# Patient Record
Sex: Male | Born: 1960 | Race: White | Hispanic: No | Marital: Single | Smoking: Current every day smoker
Health system: Southern US, Community
[De-identification: ages and names within clinical notes are randomized; demographics above are authoritative.]

## PROBLEM LIST (undated history)

## (undated) DIAGNOSIS — I10 Essential (primary) hypertension: Secondary | ICD-10-CM

---

## 2009-04-11 ENCOUNTER — Ambulatory Visit: Payer: Self-pay | Admitting: Gastroenterology

## 2012-06-13 ENCOUNTER — Ambulatory Visit: Payer: Self-pay | Admitting: Family Medicine

## 2012-06-13 LAB — RAPID STREP-A WITH REFLX: Micro Text Report: NEGATIVE

## 2012-06-16 LAB — BETA STREP CULTURE(ARMC)

## 2013-10-30 DIAGNOSIS — I10 Essential (primary) hypertension: Secondary | ICD-10-CM | POA: Insufficient documentation

## 2013-10-30 DIAGNOSIS — J302 Other seasonal allergic rhinitis: Secondary | ICD-10-CM | POA: Insufficient documentation

## 2013-10-30 DIAGNOSIS — D72819 Decreased white blood cell count, unspecified: Secondary | ICD-10-CM | POA: Insufficient documentation

## 2013-11-02 DIAGNOSIS — E781 Pure hyperglyceridemia: Secondary | ICD-10-CM | POA: Insufficient documentation

## 2014-04-30 ENCOUNTER — Ambulatory Visit: Payer: Self-pay | Admitting: Family Medicine

## 2015-03-03 ENCOUNTER — Ambulatory Visit
Admission: EM | Admit: 2015-03-03 | Discharge: 2015-03-03 | Disposition: A | Discharge status: Home / Self Care | Payer: BLUE CROSS/BLUE SHIELD | Attending: Family Medicine | Admitting: Family Medicine

## 2015-03-03 DIAGNOSIS — J0100 Acute maxillary sinusitis, unspecified: Secondary | ICD-10-CM

## 2015-03-03 DIAGNOSIS — J0110 Acute frontal sinusitis, unspecified: Secondary | ICD-10-CM | POA: Diagnosis not present

## 2015-03-03 HISTORY — DX: Essential (primary) hypertension: I10

## 2015-03-03 MED ORDER — AMOXICILLIN-POT CLAVULANATE 875-125 MG PO TABS
1.0000 | ORAL_TABLET | Freq: Two times a day (BID) | ORAL | Status: DC
Start: 2015-03-03 — End: 2016-05-16

## 2015-03-03 NOTE — Discharge Instructions (Signed)

## 2015-03-03 NOTE — ED Notes (Signed)
Started 3 1/2 weeks ago with cough. "I've gone back and forth with nasal congestion". This past week causing dizziness. + pressure behind eyes and ears

## 2015-03-03 NOTE — ED Provider Notes (Signed)
[facility]  ____________________________________________  Time seen: Approximately 3:11 PM  I have reviewed the triage vital signs and the nursing notes.   HISTORY  Chief Complaint URI   HPI Michael Dalton. is a 55 y.o. male  presents for the complaint of 3-4 weeks of runny nose, nasal congestion, cough. Patient reports that symptoms initially started as a cough and chest congestion and states that he no longer has cough and chest congestion but now has primarily sinus pressure and sinus drainage. States current sinus pressure is 4 out of 10 aching around his forehead and cheeks. Reports frequently getting thick greenish nasal drainage. Reports using Nettie pot at home as well as over-the-counter Coricidin which helps his symptoms.  Denies known fevers. Reports continues to eat and drink well. Does report some occasional dizziness with position changes. Denies current dizziness. Patient reports has a history of dizziness with sinus infections.  Denies chest pain or shortness of breath, fevers, neck or back pain, fall, head injury, weakness, fevers.    Past Medical History  Diagnosis Date  . Hypertension     There are no active problems to display for this patient.   History reviewed. No pertinent past surgical history.  Current Outpatient Rx  Name  Route  Sig  Dispense  Refill  . Chlorpheniramine-Acetaminophen (CORICIDIN HBP COLD/FLU PO)   Oral   Take by mouth.         Marland Kitchen lisinopril (PRINIVIL,ZESTRIL) 10 MG tablet   Oral   Take 10 mg by mouth daily.           Allergies Review of patient's allergies indicates no known allergies.  Family History  Problem Relation Age of Onset  . Cancer Father     Social History Social History  Substance Use Topics  . Smoking status: Current Every Day Smoker -- 1.00 packs/day    Types: Cigarettes  . Smokeless tobacco: None  . Alcohol Use: Yes    Review of Systems Constitutional: No fever/chills Eyes: No  visual changes. ENT: No sore throat. Positive runny nose, nasal congestion, sinus pressure and sinus drainage. Cardiovascular: Denies chest pain. Respiratory: Denies shortness of breath. Gastrointestinal: No abdominal pain.  No nausea, no vomiting.  No diarrhea.  No constipation. Genitourinary: Negative for dysuria. Musculoskeletal: Negative for back pain. Skin: Negative for rash. Neurological: Negative for headaches, focal weakness or numbness.  10-point ROS otherwise negative.  ____________________________________________   PHYSICAL EXAM:  VITAL SIGNS: ED Triage Vitals  Enc Vitals Group     BP 03/03/15 1458 156/100 mmHg     Pulse Rate 03/03/15 1458 81     Resp 03/03/15 1458 16     Temp 03/03/15 1458 98.3 F (36.8 C)     Temp Source 03/03/15 1458 Tympanic     SpO2 03/03/15 1458 97 %     Weight 03/03/15 1458 210 lb (95.255 kg)     Height 03/03/15 1458 6\' 1"  (1.854 m)     Head Cir --      Peak Flow --      Pain Score 03/03/15 1502 9     Pain Loc --      Pain Edu? --      Excl. in GC? --     Constitutional: Alert and oriented. Well appearing and in no acute distress. Eyes: Conjunctivae are normal. PERRL. EOMI. Head: Atraumatic. Mild to moderate tenderness to palpation bilateral frontal and maxillary sinuses. No swelling. No erythema.  Ears: no erythema, normal TMs bilaterally.  Nose:Nasal congestion with bilateral nasal turbinate erythema.   Mouth/Throat: Mucous membranes are moist.  Oropharynx non-erythematous. No tonsillar swelling or exudate.  Neck: No stridor.  No cervical spine tenderness to palpation. Hematological/Lymphatic/Immunilogical: No cervical lymphadenopathy. Cardiovascular: Normal rate, regular rhythm. Grossly normal heart sounds.  Good peripheral circulation. Respiratory: Normal respiratory effort.  No retractions. Lungs CTAB. No wheezes, rales or rhonchi.  Gastrointestinal: Soft and nontender.  Musculoskeletal: No lower or upper extremity tenderness  nor edema. No cervical, thoracic or lumbar tenderness to palpation.  Neurologic:  Normal speech and language. No gross focal neurologic deficits are appreciated. No gait instability. Skin:  Skin is warm, dry and intact. No rash noted. Psychiatric: Mood and affect are normal. Speech and behavior are normal.  ____________________________________________   LABS (all labs ordered are listed, but only abnormal results are displayed)  Labs Reviewed - No data to display ____________________________________________   INITIAL IMPRESSION / ASSESSMENT AND PLAN / ED COURSE  Pertinent labs & imaging results that were available during my care of the patient were reviewed by me and considered in my medical decision making (see chart for details).  Very well-appearing patient. No acute distress. Presents for complaint of 3-4 weeks of nasal congestion, sinus pressure and sinus drainage. Mild to moderate tenderness to palpation of frontal and maxillary sinuses. Unrelieved with over-the-counter medications. Lungs clear throughout. Abdomen soft and nontender. Moist mucous membranes. Will treat maxillary and frontal sinusitis with oral Augmentin and supportive treatments including continuing home Nettie pot, saline rinses and Coricidin. Follow PCP as needed.  Discussed follow up with Primary care physician this week. Discussed follow up and return parameters including chest pain, shortness of breath, abdominal pain, dizziness, weakness, vision changes,  no resolution or any worsening concerns. Patient verbalized understanding and agreed to plan.   ____________________________________________   FINAL CLINICAL IMPRESSION(S) / ED DIAGNOSES  Final diagnoses:  Acute frontal sinusitis, recurrence not specified  Acute maxillary sinusitis, recurrence not specified       Renford DillsLindsey Manjinder Breau, NP 03/03/15 1705

## 2016-05-16 ENCOUNTER — Ambulatory Visit
Admission: EM | Admit: 2016-05-16 | Discharge: 2016-05-16 | Disposition: A | Discharge status: Home / Self Care | Payer: BLUE CROSS/BLUE SHIELD | Attending: Family Medicine | Admitting: Family Medicine

## 2016-05-16 ENCOUNTER — Ambulatory Visit
Admit: 2016-05-16 | Discharge: 2016-05-16 | Disposition: A | Discharge status: Home / Self Care | Payer: BLUE CROSS/BLUE SHIELD | Source: Ambulatory Visit | Attending: Emergency Medicine | Admitting: Emergency Medicine

## 2016-05-16 DIAGNOSIS — N451 Epididymitis: Secondary | ICD-10-CM

## 2016-05-16 DIAGNOSIS — R52 Pain, unspecified: Secondary | ICD-10-CM | POA: Diagnosis not present

## 2016-05-16 DIAGNOSIS — N5082 Scrotal pain: Secondary | ICD-10-CM | POA: Diagnosis present

## 2016-05-16 LAB — URINALYSIS, COMPLETE (UACMP) WITH MICROSCOPIC
Bilirubin Urine: NEGATIVE
Glucose, UA: NEGATIVE mg/dL
Ketones, ur: NEGATIVE mg/dL
Nitrite: POSITIVE — AB
Protein, ur: 30 mg/dL — AB
Specific Gravity, Urine: 1.02 (ref 1.005–1.030)
Squamous Epithelial / LPF: NONE SEEN
pH: 7 (ref 5.0–8.0)

## 2016-05-16 LAB — CHLAMYDIA/NGC RT PCR (ARMC ONLY)
Chlamydia Tr: NOT DETECTED
N gonorrhoeae: NOT DETECTED

## 2016-05-16 MED ORDER — TRAMADOL HCL 50 MG PO TABS: 50.0000 mg | ORAL_TABLET | Freq: Four times a day (QID) | ORAL | 0 refills | Status: DC | PRN

## 2016-05-16 MED ORDER — LEVOFLOXACIN 500 MG PO TABS
500.0000 mg | ORAL_TABLET | Freq: Once | ORAL | Status: AC
  Administered 2016-05-16: 500 mg via ORAL

## 2016-05-16 MED ORDER — LEVOFLOXACIN 500 MG PO TABS: 500.0000 mg | ORAL_TABLET | Freq: Every day | ORAL | 0 refills | Status: DC

## 2016-05-16 MED ORDER — CEFTRIAXONE SODIUM 250 MG IJ SOLR
500.0000 mg | Freq: Once | INTRAMUSCULAR | Status: AC
  Administered 2016-05-16: 500 mg via INTRAMUSCULAR

## 2016-05-16 NOTE — ED Provider Notes (Signed)
CSN: [id number]     Arrival date & time 05/16/16  7425 History   First MD Initiated Contact with Patient 05/16/16 [phone]     Chief Complaint  Patient presents with  . Testicle Pain    left   (Consider location/radiation/quality/duration/timing/severity/associated sxs/prior Treatment) HPI  56 year old male who presents with left testicular pain started hurting about 2 weeks ago.He stated that now has become more swollen and looks inflamed. Does state that he gets epididymitis about every 2-3 years. He has associated when he lifts heavy things. 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 Denies any fever or chills. He states that that he is homosexual has a regular partner but does practice insertive anal intercourse without a condom. He recently noticed a discharge from his penis with a yellow discharge noticed on his underwear.       Past Medical History:  Diagnosis Date  . Hypertension    History reviewed. No pertinent surgical history. Family History  Problem Relation Age of Onset  . Cancer Father    Social History  Substance Use Topics  . Smoking status: Current Every Day Smoker    Packs/day: 1.00    Types: Cigarettes  . Smokeless tobacco: Never Used  . Alcohol use Yes    Review of Systems  Constitutional: Positive for activity change. Negative for chills, fatigue and fever.  Genitourinary: Positive for discharge, dysuria, scrotal swelling and testicular pain. Negative for penile pain.  All other systems reviewed and are negative.   Allergies  Patient has no known allergies.  Home Medications   Prior to Admission medications   Medication Sig Start Date End Date Taking? Authorizing Provider  lisinopril (PRINIVIL,ZESTRIL) 10 MG tablet Take 10 mg by mouth daily.   Yes Historical Provider, MD   levofloxacin (LEVAQUIN) 500 MG tablet Take 1 tablet (500 mg total) by mouth daily. 05/16/16   Lutricia Feil, PA-C  traMADol (ULTRAM) 50 MG tablet Take 1 tablet (50 mg total) by mouth every 6 (six) hours as needed. 05/16/16   Lutricia Feil, PA-C   Meds Ordered and Administered this Visit   Medications  cefTRIAXone (ROCEPHIN) injection 500 mg (500 mg Intramuscular Given 05/16/16 0959)  levofloxacin (LEVAQUIN) tablet 500 mg (500 mg Oral Given 05/16/16 1001)    BP (!) 144/94 (BP Location: Left Arm)   Pulse 99   Temp 98.3 F (36.8 C) (Oral)   Resp 18   Ht 6' (1.829 m)   Wt 210 lb (95.3 kg)   SpO2 100%   BMI 28.48 kg/m  No data found.   Physical Exam  Constitutional: He is oriented to person, place, and time. He appears well-developed and well-nourished. No distress.  HENT:  Head: Normocephalic and atraumatic.  Eyes: Pupils are equal, round, and reactive to light.  Neck: Normal range of motion.  Abdominal: Soft. He exhibits no distension. There is no tenderness. There is no rebound and no guarding.  Genitourinary: Penis normal.  Genitourinary Comments: Male genital examination shows a normal male phallus. No ecchymosis or erythema present. The right is normal. Left scrotum shows some swelling. Right testicle is normal and does not have any tenderness or swelling. However left is swollen has a lateral lying orientation. There is increased swelling posteriorly up into the upper part of the scrotum. Cremasteric  reflex present. No herniation directly,but possible scrotal indirect herniation in his scrotum. Swelling limits palpation.  Musculoskeletal: Normal range of motion.  Neurological: He is alert and oriented to person, place, and time.  Skin: Skin is warm and dry. He is not diaphoretic.  Psychiatric: He has a normal mood and affect. His behavior is normal. Judgment and thought content normal.  Nursing note and vitals reviewed.   Urgent Care Course     Procedures (including  critical care time)  Labs Review Labs Reviewed  URINALYSIS, COMPLETE (UACMP) WITH MICROSCOPIC - Abnormal; Notable for the following:       Result Value   APPearance CLOUDY (*)    Hgb urine dipstick MODERATE (*)    Protein, ur 30 (*)    Nitrite POSITIVE (*)    Leukocytes, UA LARGE (*)    Bacteria, UA MANY (*)    All other components within normal limits  URINE CULTURE  CHLAMYDIA/NGC RT PCR [facility] ONLY)    Imaging Review US Scrotum  Result Date: 05/16/2016 CLINICAL DATA:  Left scrotal pain and swelling. EXAM: SCROTAL ULTRASOUND DOPPLER ULTRASOUND OF THE TESTICLES TECHNIQUE: Complete ultrasound examination of the testicles, epididymis, and other scrotal structures was performed. Color and spectral Doppler ultrasound were also utilized to evaluate blood flow to the testicles. COMPARISON:  None. FINDINGS: Right testicle Measurements: 4.9 x 2.5 x 3.1 cm, within normal limits. No mass or microlithiasis visualized. Left testicle Measurements: 4.2 x 2.0 x 3.3 cm, within normal limits. No mass or microlithiasis visualized. Right epididymis:  Normal in size and appearance. Left epididymis: The left epididymis is enlarged. Doppler imaging demonstrates increased vascularity to the left epididymis. Hydrocele:  None visualized. Varicocele:  None visualized. Pulsed Doppler interrogation of both testes demonstrates normal low resistance arterial and venous waveforms bilaterally. Measurements: 4.9 x 2.5 x 3.1 cm, within normal limits. No fibroids or other mass visualized. IMPRESSION: 1. Left epididymitis without complication. 2. Normal appearance of the testicles bilaterally. Electronically Signed   By: Marin Roberts M.D.   On: 05/16/2016 11:46   [place] Art/ven Flow Abd Pelv Doppler  Result Date: 05/16/2016 CLINICAL DATA:  Left scrotal pain and swelling. EXAM: SCROTAL ULTRASOUND DOPPLER ULTRASOUND OF THE TESTICLES TECHNIQUE: Complete ultrasound examination of the testicles, epididymis, and other  scrotal structures was performed. Color and spectral Doppler ultrasound were also utilized to evaluate blood flow to the testicles. COMPARISON:  None. FINDINGS: Right testicle Measurements: 4.9 x 2.5 x 3.1 cm, within normal limits. No mass or microlithiasis visualized. Left testicle Measurements: 4.2 x 2.0 x 3.3 cm, within normal limits. No mass or microlithiasis visualized. Right epididymis:  Normal in size and appearance. Left epididymis: The left epididymis is enlarged. Doppler imaging demonstrates increased vascularity to the left epididymis. Hydrocele:  None visualized. Varicocele:  None visualized. Pulsed Doppler interrogation of both testes demonstrates normal low resistance arterial and venous waveforms bilaterally. Measurements: 4.9 x 2.5 x 3.1 cm, within normal limits. No fibroids or other mass visualized. IMPRESSION: 1. Left epididymitis without complication. 2. Normal appearance of the testicles bilaterally. Electronically Signed   By: Marin Roberts M.D.   On: 05/16/2016 11:46     Visual Acuity Review  Right Eye Distance:   Left Eye Distance:   Bilateral Distance:    Right Eye Near:   Left Eye Near:    Bilateral Near:    Medications  cefTRIAXone (ROCEPHIN) injection 500 mg (500 mg Intramuscular Given 05/16/16 0959)  levofloxacin (LEVAQUIN) tablet 500 mg (500 mg Oral Given 05/16/16 1001)  Patient will be sent for a scrotal ultrasound to rule out epididymitis versus indirect herniation. [facility]    MDM   1. Epididymitis, left   2. Pain    Discharge Medication List as of 05/16/2016 10:09 AM    START taking these medications   Details  levofloxacin (LEVAQUIN) 500 MG tablet Take 1 tablet (500 mg total) by mouth daily., Starting Thu 05/16/2016, Normal    traMADol (ULTRAM) 50 MG tablet Take 1 tablet (50 mg total) by mouth every 6 (six) hours as needed., Starting Thu 05/16/2016, Print      Plan: 1. Test/x-ray results  and diagnosis reviewed with patient 2. rx as per orders; risks, benefits, potential side effects reviewed with patient 3. Recommend supportive treatment with rest 20 minutes out of every 2 hours times 4 daily. Recommend supportive underwear and elevation of the scrotum. He is treated empirically for GC chlamydia and will be continued on levofloxacin 500 mg daily for a total of 10 days. I recommended he follow-up with his primary care physician next week. I've also recommended that he consider safe sex practices using a condom for intercourse. Cultures of the Chlamydia /gonorrhea should be available by tomorrow. His urine will also be cultured. 4. F/u prn if symptoms worsen or don't improve     Lutricia FeilWilliam P Emberlin Verner, PA-C 05/16/16 1037    Addendum: I was notified by ultrasound that the patient does have a confirmed left epididymitis. There is no hernia mass seen in the scrotum. Spoke with the patient directly by phone and told him of the findings. He acknowledged. We'll have the results of the GC chlamydia done later this afternoon or tomorrow and the urine culture is within 48 hours. Continue taking levofloxacin for  9 more days. Encouraged him to follow-up with his primary care physician next week   Lutricia FeilWilliam P Hallis Meditz, PA-C 05/16/16 1206

## 2016-05-16 NOTE — ED Triage Notes (Signed)
Pt c/o left testicle pain, it started hurting about 2 weeks ago and it seems to be swollen and he says it looks inflamed. He says he gets epididymitis every 2-3 years. He says that it happens when he lifts heavy things and about 2 weeks ago he was moving some things.

## 2016-05-18 LAB — URINE CULTURE: Culture: >=100000 — AB

## 2017-04-22 IMAGING — US US SCROTUM
1 series · 14 of 25 positions shown · non-contrast
Comparison: None.

CLINICAL DATA: Left scrotal pain and swelling.

EXAM:
SCROTAL ULTRASOUND
DOPPLER ULTRASOUND OF THE TESTICLES
TECHNIQUE: Complete ultrasound examination of the testicles, epididymis, and
other scrotal structures was performed. Color and spectral Doppler
ultrasound were also utilized to evaluate blood flow to the
testicles.

[Series 1: us scrotum · 0.08mm/px · 14 of 51 slices shown]
[im 1/51]
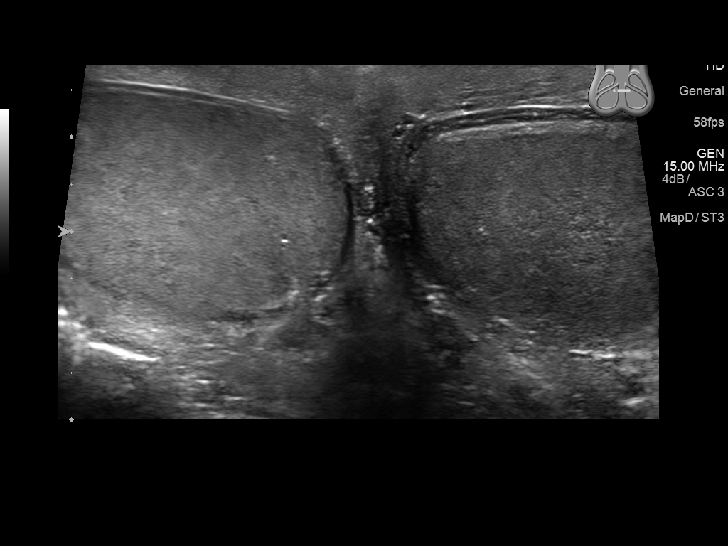
[im 5/51]
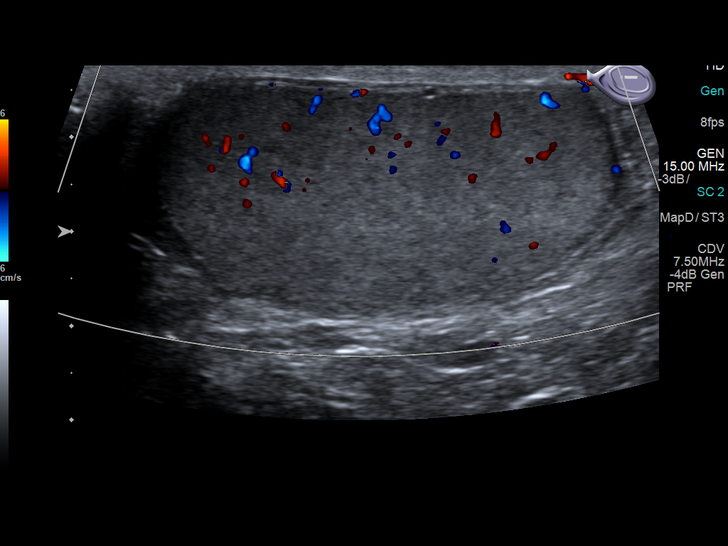
[im 9/51]
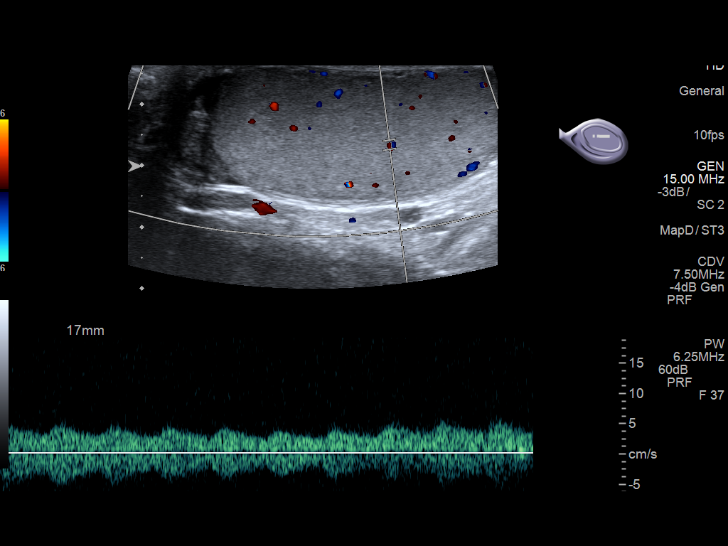
[im 13/51]
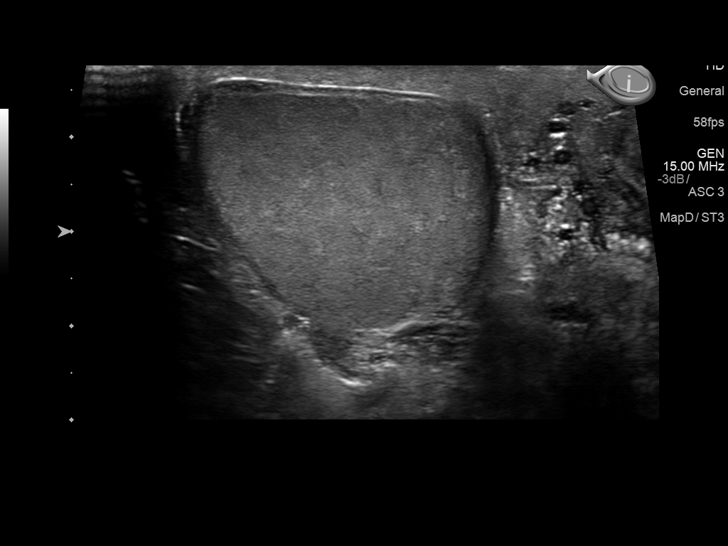
[im 17/51]
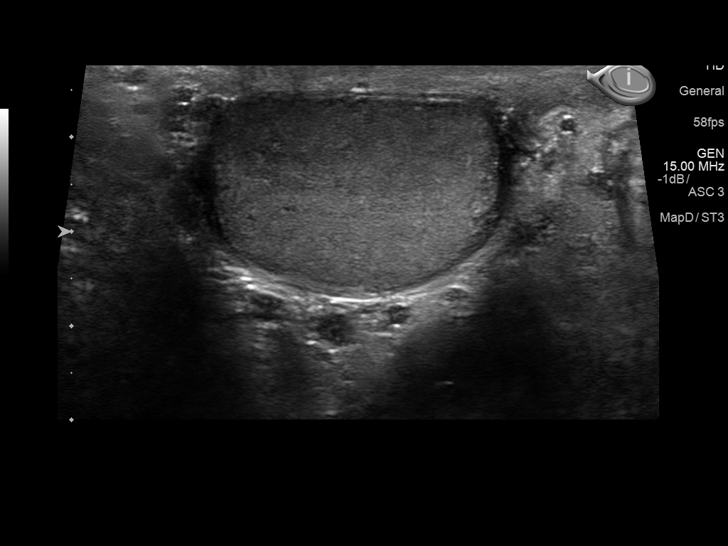
[im 19/51]
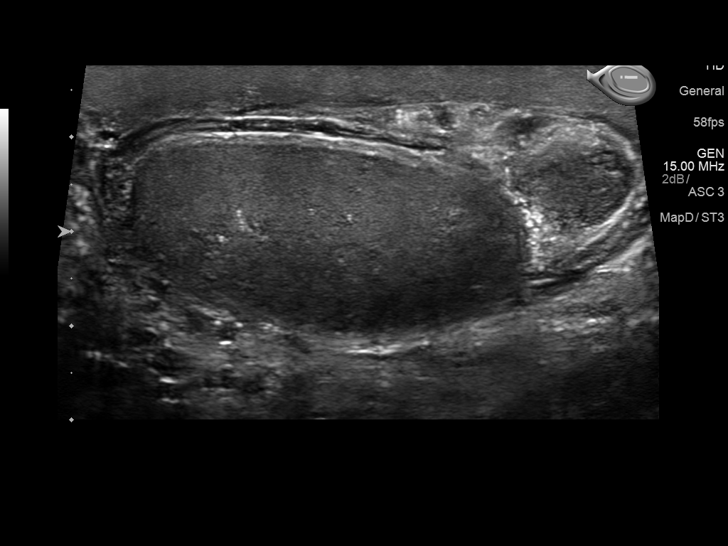
[im 23/51]
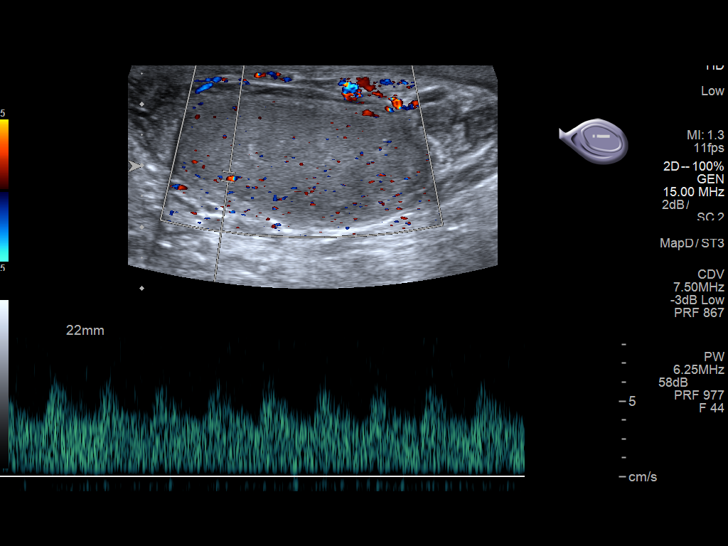
[im 28/51]
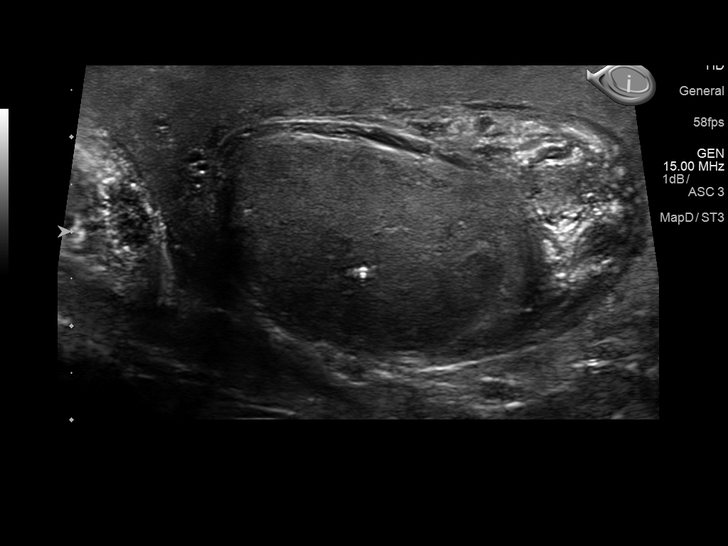
[im 32/51]
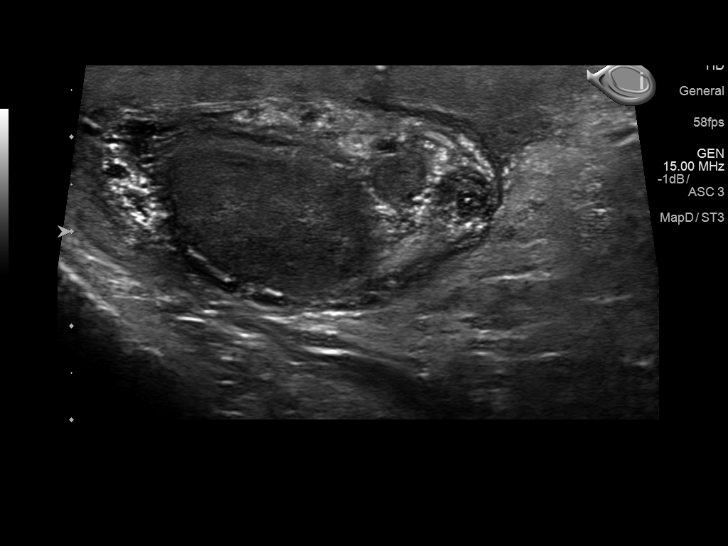
[im 34/51]
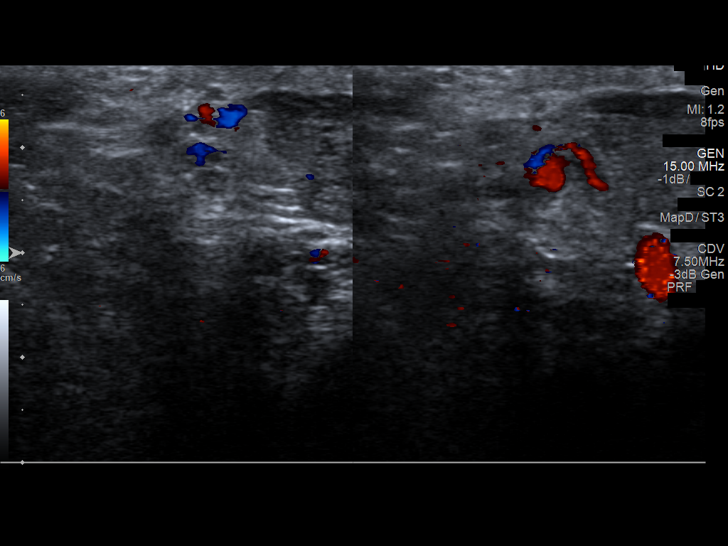
[im 38/51]
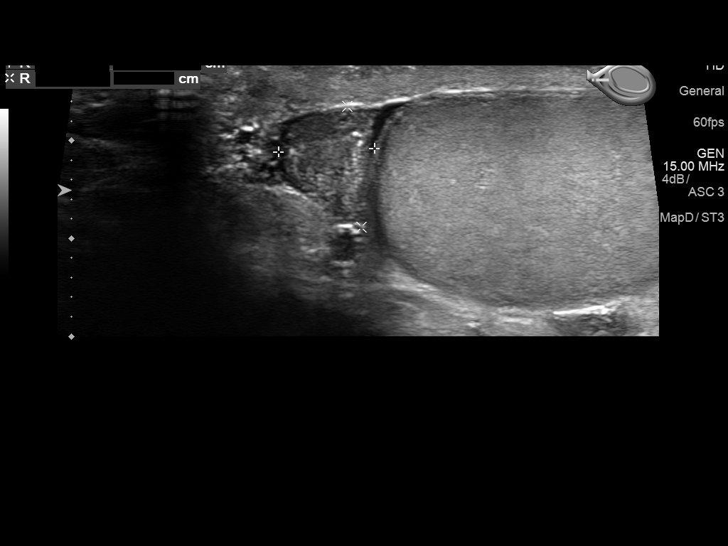
[im 42/51]
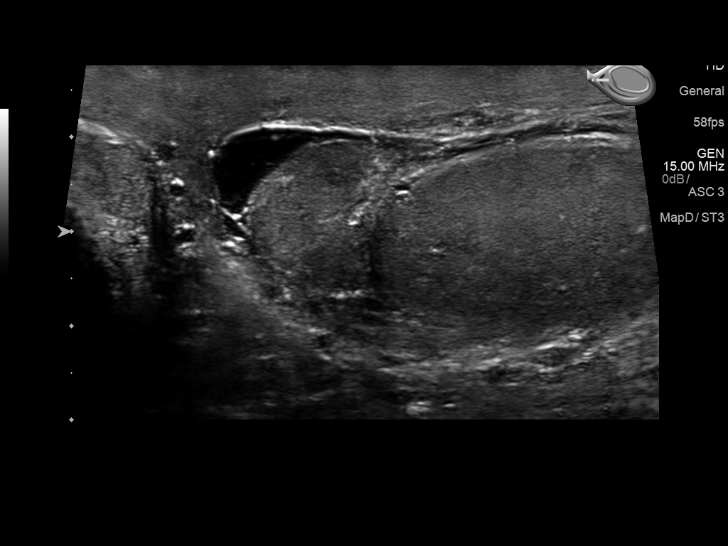
[im 46/51]
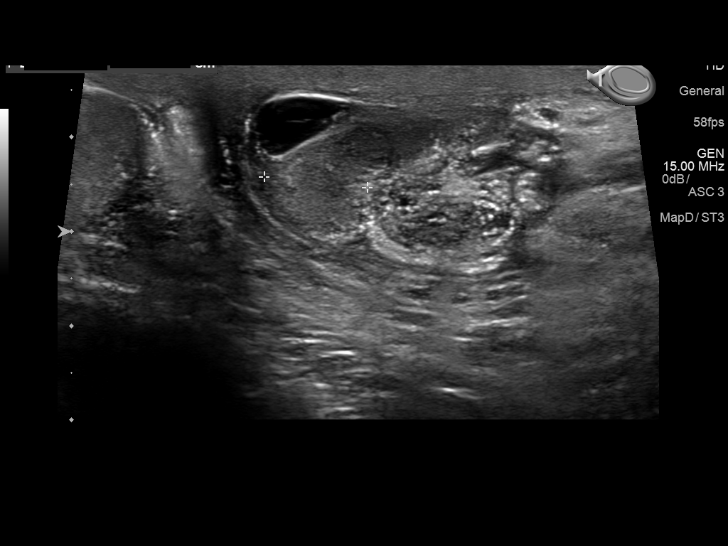
[im 51/51]
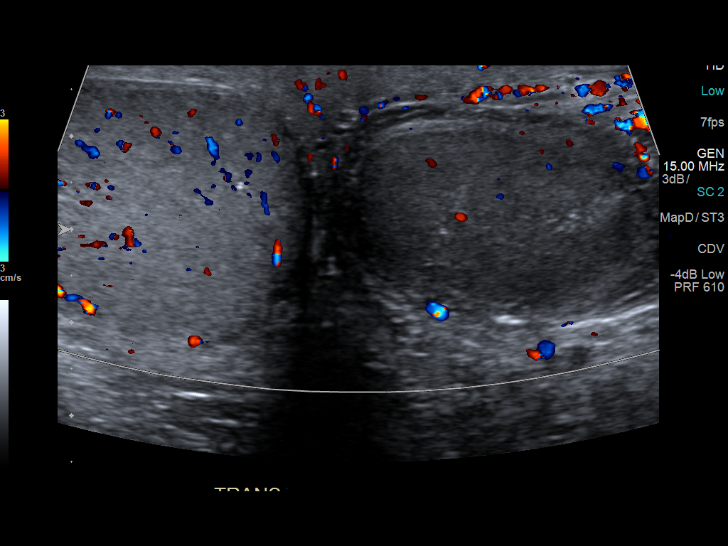

[14 of 25 positions shown; findings below may reference images not displayed]

FINDINGS: Right testicle

Measurements: 4.9 x 2.5 x 3.1 cm, within normal limits. No mass or
microlithiasis visualized.

Left testicle

Measurements: 4.2 x 2.0 x 3.3 cm, within normal limits. No mass or
microlithiasis visualized.

Right epididymis:  Normal in size and appearance.

Left epididymis: The left epididymis is enlarged. Doppler imaging
demonstrates increased vascularity to the left epididymis.

Hydrocele:  None visualized.

Varicocele:  None visualized.

Pulsed Doppler interrogation of both testes demonstrates normal low
resistance arterial and venous waveforms bilaterally.

Measurements: 4.9 x 2.5 x 3.1 cm, within normal limits. No fibroids
or other mass visualized.
IMPRESSION: 1. Left epididymitis without complication.
2. Normal appearance of the testicles bilaterally.

## 2018-10-13 ENCOUNTER — Other Ambulatory Visit: Payer: Self-pay

## 2018-10-13 DIAGNOSIS — Z20822 Contact with and (suspected) exposure to covid-19: Secondary | ICD-10-CM

## 2018-10-14 LAB — NOVEL CORONAVIRUS, NAA: SARS-CoV-2, NAA: NOT DETECTED

## 2018-10-15 ENCOUNTER — Telehealth: Payer: Self-pay

## 2018-10-15 NOTE — Telephone Encounter (Signed)
Negative COVID results given. Patient results "NOT Detected." Caller expressed understanding. ° °

## 2019-01-19 DIAGNOSIS — H5213 Myopia, bilateral: Secondary | ICD-10-CM | POA: Insufficient documentation

## 2019-01-19 DIAGNOSIS — H43393 Other vitreous opacities, bilateral: Secondary | ICD-10-CM | POA: Insufficient documentation

## 2019-01-19 DIAGNOSIS — H2513 Age-related nuclear cataract, bilateral: Secondary | ICD-10-CM | POA: Insufficient documentation

## 2020-01-31 ENCOUNTER — Ambulatory Visit: Payer: Self-pay

## 2020-04-07 ENCOUNTER — Ambulatory Visit: Payer: Self-pay | Attending: Internal Medicine

## 2020-04-07 ENCOUNTER — Other Ambulatory Visit (HOSPITAL_COMMUNITY): Payer: Self-pay | Admitting: Internal Medicine

## 2020-04-07 DIAGNOSIS — Z23 Encounter for immunization: Secondary | ICD-10-CM

## 2020-04-07 NOTE — Progress Notes (Signed)
   Covid-19 Vaccination Clinic  Name:  Michael Dalton    MRN: [id number] DOB: October 02, 1960  04/07/2020  Michael Dalton was observed post Covid-19 immunization for 15 minutes without incident. He was provided with Vaccine Information Sheet and instruction to access the V-Safe system.   Michael Dalton was instructed to call 911 with any severe reactions post vaccine: Marland Kitchen Difficulty breathing  . Swelling of face and throat  . A fast heartbeat  . A bad rash all over body  . Dizziness and weakness   Immunizations Administered    Name Date Dose VIS Date Route   PFIZER Comrnaty(Gray TOP) Covid-19 Vaccine 04/07/2020  9:46 AM 0.3 mL 01/27/2020 Intramuscular   Manufacturer: [employer], [employer]   Lot: [id number]   NDC: [phone]

## 2020-04-10 MED FILL — PFIZER-BIONT COVID-19 VAC-T: 30 | 21 days supply | Qty: 0 | Fill #0

## 2020-08-10 LAB — HM COLONOSCOPY

## 2021-01-03 ENCOUNTER — Ambulatory Visit: Payer: BLUE CROSS/BLUE SHIELD | Attending: Internal Medicine

## 2021-01-03 ENCOUNTER — Other Ambulatory Visit (HOSPITAL_BASED_OUTPATIENT_CLINIC_OR_DEPARTMENT_OTHER): Payer: Self-pay

## 2021-01-03 DIAGNOSIS — Z23 Encounter for immunization: Secondary | ICD-10-CM

## 2021-01-03 MED ORDER — INFLUENZA VAC SPLIT QUAD 0.5 ML IM SUSY
PREFILLED_SYRINGE | INTRAMUSCULAR | 0 refills | Status: DC
  Filled 2021-01-03: qty 0.5, 1d supply, fill #0

## 2021-01-04 NOTE — Progress Notes (Signed)
   [id number] Vaccination [facility]  Name:  Michael Dalton.    MRN: [id number] DOB: May 10, 1960  01/04/2021  Mr. Hooper was observed post Covid-19 immunization for 15 minutes without incident. He was provided with Vaccine Information Sheet and instruction to access the V-Safe system.   Mr. Gullett was instructed to call 911 with any severe reactions post vaccine: Difficulty breathing  Swelling of face and throat  A fast heartbeat  A bad rash all over body  Dizziness and weakness   Immunizations Administered     Name Date Dose VIS Date Route   Pfizer Covid-19 Vaccine Bivalent Booster 01/03/2021  1:50 PM 0.3 mL 10/18/2020 Intramuscular   Manufacturer: [employer], [employer]   Lot: [id number]   NDC: [phone]

## 2021-01-19 ENCOUNTER — Other Ambulatory Visit (HOSPITAL_BASED_OUTPATIENT_CLINIC_OR_DEPARTMENT_OTHER): Payer: Self-pay

## 2021-01-19 MED ORDER — PFIZER COVID-19 VAC BIVALENT 30 MCG/0.3ML IM SUSP
INTRAMUSCULAR | 0 refills | Status: DC
  Filled 2021-01-19: qty 0.3, 1d supply, fill #0

## 2021-03-19 ENCOUNTER — Ambulatory Visit: Payer: BLUE CROSS/BLUE SHIELD | Admitting: Nurse Practitioner

## 2021-09-20 ENCOUNTER — Ambulatory Visit (INDEPENDENT_AMBULATORY_CARE_PROVIDER_SITE_OTHER): Payer: 59 | Admitting: Family Medicine

## 2021-09-20 ENCOUNTER — Encounter: Payer: Self-pay | Admitting: Family Medicine

## 2021-09-20 VITALS — BP 154/98 | HR 88 | Ht 72.0 in | Wt 189.0 lb

## 2021-09-20 DIAGNOSIS — Z862 Personal history of diseases of the blood and blood-forming organs and certain disorders involving the immune mechanism: Secondary | ICD-10-CM

## 2021-09-20 DIAGNOSIS — I10 Essential (primary) hypertension: Secondary | ICD-10-CM

## 2021-09-20 DIAGNOSIS — F419 Anxiety disorder, unspecified: Secondary | ICD-10-CM | POA: Diagnosis not present

## 2021-09-20 DIAGNOSIS — R5383 Other fatigue: Secondary | ICD-10-CM | POA: Diagnosis not present

## 2021-09-20 DIAGNOSIS — K649 Unspecified hemorrhoids: Secondary | ICD-10-CM | POA: Diagnosis not present

## 2021-09-20 DIAGNOSIS — R69 Illness, unspecified: Secondary | ICD-10-CM | POA: Diagnosis not present

## 2021-09-20 DIAGNOSIS — F32A Depression, unspecified: Secondary | ICD-10-CM

## 2021-09-20 MED ORDER — AMLODIPINE BESYLATE 5 MG PO TABS: 5.0000 mg | ORAL_TABLET | Freq: Every day | ORAL | 3 refills | Status: DC

## 2021-09-20 MED ORDER — SERTRALINE HCL 25 MG PO TABS: 25.0000 mg | ORAL_TABLET | Freq: Every day | ORAL | 3 refills | Status: DC

## 2021-09-20 MED ORDER — HYDROCORTISONE (PERIANAL) 2.5 % EX CREA: 1.0000 | TOPICAL_CREAM | Freq: Two times a day (BID) | CUTANEOUS | 0 refills | Status: DC

## 2021-09-20 NOTE — Progress Notes (Signed)
Date:  09/20/2021   Name:  Michael Dalton.   DOB:  09/01/1960   MRN:  [id number]   Chief Complaint: Establish Care and Depression (7 and 12- started after retirement)  Depression        This is a chronic problem.  The current episode started more than 1 year ago.   The onset quality is gradual.   The problem has been waxing and waning since onset.  Associated symptoms include fatigue, helplessness, decreased interest and sad.  Associated symptoms include no headaches.   No results found for: "NA", "K", "CO2", "GLUCOSE", "BUN", "CREATININE", "CALCIUM", "EGFR", "GFRNONAA" No results found for: "CHOL", "HDL", "LDLCALC", "LDLDIRECT", "TRIG", "CHOLHDL" No results found for: "TSH" No results found for: "HGBA1C" No results found for: "WBC", "HGB", "HCT", "MCV", "PLT" No results found for: "ALT", "AST", "GGT", "ALKPHOS", "BILITOT" No results found for: "25OHVITD2", "25OHVITD3", "VD25OH"   Review of Systems  Constitutional:  Positive for fatigue.  HENT:  Negative for nosebleeds, rhinorrhea and trouble swallowing.   Eyes:  Negative for visual disturbance.  Respiratory:  Negative for cough, chest tightness and shortness of breath.   Cardiovascular:  Negative for chest pain, palpitations and leg swelling.  Gastrointestinal:  Negative for abdominal distention, abdominal pain, blood in stool and constipation.  Endocrine: Negative for polydipsia and polyuria.  Genitourinary:  Negative for difficulty urinating.  Musculoskeletal:  Negative for back pain.  Skin:  Negative for color change.  Neurological:  Negative for headaches.  Hematological:  Negative for adenopathy.  Psychiatric/Behavioral:  Positive for depression.     There are no problems to display for this patient.   No Known Allergies  No past surgical history on file.  Social History   Tobacco Use   Smokeless tobacco: Never  Vaping Use   Vaping Use: Never used  Substance Use Topics   Alcohol use: Yes     Alcohol/week: 3.0 standard drinks of alcohol    Types: 3 Cans of beer per week    Comment: 3 beers/ daily   Drug use: Never     Medication list has been reviewed and updated.  Current Meds  Medication Sig   lisinopril-hydrochlorothiazide (ZESTORETIC) 20-12.5 MG tablet Take 1 tablet by mouth every morning.   metoprolol succinate (TOPROL-XL) 25 MG 24 hr tablet Take 25 mg by mouth daily.   [DISCONTINUED] lisinopril (PRINIVIL,ZESTRIL) 10 MG tablet Take 10 mg by mouth daily.       09/20/2021    1:55 PM  GAD 7 : Generalized Anxiety Score  Nervous, Anxious, on Edge 2  Control/stop worrying 2  Worry too much - different things 2  Trouble relaxing 2  Restless 2  Easily annoyed or irritable 2  Afraid - awful might happen 0  Total GAD 7 Score 12  Anxiety Difficulty Not difficult at all       09/20/2021    1:55 PM  Depression screen PHQ 2/9  Decreased Interest 2  Down, Depressed, Hopeless 1  PHQ - 2 Score 3  Altered sleeping 0  Tired, decreased energy 2  Change in appetite 0  Feeling bad or failure about yourself  2  Trouble concentrating 0  Moving slowly or fidgety/restless 0  Suicidal thoughts 0  PHQ-9 Score 7  Difficult doing work/chores Not difficult at all    BP Readings from Last 3 Encounters:  09/20/21 (!) 160/100  05/16/16 (!) 144/94  03/03/15 (!) 156/100    Physical Exam Vitals and nursing note reviewed.  HENT:  Head: Normocephalic.     Right Ear: External ear normal.     Left Ear: External ear normal.     Nose: Nose normal.  Eyes:     General: No scleral icterus.       Right eye: No discharge.        Left eye: No discharge.     Conjunctiva/sclera: Conjunctivae normal.     Pupils: Pupils are equal, round, and reactive to light.     Comments: Conjunctiva pale  Neck:     Thyroid: No thyroid mass, thyromegaly or thyroid tenderness.     Vascular: No JVD.     Trachea: No tracheal deviation.  Cardiovascular:     Rate and Rhythm: Normal rate and regular  rhythm.     Heart sounds: Normal heart sounds, S1 normal and S2 normal. No murmur heard.    No systolic murmur is present.     No diastolic murmur is present.     No friction rub. No gallop. No S3 or S4 sounds.  Pulmonary:     Effort: No respiratory distress.     Breath sounds: Normal breath sounds. No wheezing, rhonchi or rales.  Abdominal:     General: Bowel sounds are normal.     Palpations: Abdomen is soft. There is no hepatomegaly, splenomegaly or mass.     Tenderness: There is no abdominal tenderness. There is no guarding or rebound.  Musculoskeletal:        General: No tenderness. Normal range of motion.     Cervical back: Normal range of motion and neck supple.  Lymphadenopathy:     Head:     Right side of head: No submandibular adenopathy.     Left side of head: No submandibular adenopathy.     Cervical: No cervical adenopathy.     Right cervical: No superficial, deep or posterior cervical adenopathy.    Left cervical: No superficial, deep or posterior cervical adenopathy.  Skin:    General: Skin is warm.     Findings: No rash.  Neurological:     Mental Status: He is alert.     Wt Readings from Last 3 Encounters:  09/20/21 189 lb (85.7 kg)  05/16/16 210 lb (95.3 kg)  03/03/15 210 lb (95.3 kg)    BP (!) 160/100   Pulse 88   Ht 6' (1.829 m)   Wt 189 lb (85.7 kg)   BMI 25.63 kg/m   Assessment and Plan:  Patient is a 61 year old male who presents for a establish new physician exam. The patient reports the following problems: Fatigue/hypertension/history of anemia/depression and anxiety.. Health maintenance has been reviewed pending physical exam  1. Primary hypertension Chronic.  Uncontrolled.  Blood pressure 154/98.  Continue lisinopril hydrochlorothiazide 20-12.5 mg once a day and metoprolol XL 25 mg once a day.  We will also add amlodipine 5 mg once a day and will recheck in 6 weeks.  We will check renal function panel for electrolytes and GFR. -  lisinopril-hydrochlorothiazide (ZESTORETIC) 20-12.5 MG tablet; Take 1 tablet by mouth every morning. - metoprolol succinate (TOPROL-XL) 25 MG 24 hr tablet; Take 25 mg by mouth daily. - Renal Function Panel - amLODipine (NORVASC) 5 MG tablet; Take 1 tablet (5 mg total) by mouth daily.  Dispense: 30 tablet; Refill: 3  2. Fatigue, unspecified type Chronic.  Persistent.  Patient has multiple reasons for fatigue but we will begin the screening process prior to his physical including a thyroid panel CBC HIV antibody  hep C renal function panel and hepatic panel. - Thyroid Panel With TSH - CBC with Differential/Platelet - HIV Antibody (routine testing w rflx) - Hepatitis C antibody - Renal Function Panel - Hepatic Function Panel (6)  3. History of anemia Patient has a history of anemia but is normocytic.  We will check a CBC. - CBC with Differential/Platelet  4. Anxiety and depression Chronic.  Controlled.  Stable.  PHQ is score 7.  GAD score is 12.  Patient desires to go on sertraline 25 mg given that there is family members who have had good results.  We will begin 25 mg once a day and taper slowly. - sertraline (ZOLOFT) 25 MG tablet; Take 1 tablet (25 mg total) by mouth daily.  Dispense: 30 tablet; Refill: 3  5. Hemorrhoids, unspecified hemorrhoid type Patient has by description what sounds like maybe a hemorrhoid.  We will give some Anusol HC on a trial basis and have also suggested sitz bath's and we will be rechecking at the time we will do his physical exam.  Patient has had a colonoscopy on 06/21/2020 which was uneventful.  Pathology report - hydrocortisone (ANUSOL-HC) 2.5 % rectal cream; Place 1 Application rectally 2 (two) times daily.  Dispense: 30 g; Refill: 0

## 2021-09-20 NOTE — Patient Instructions (Signed)

## 2021-09-21 LAB — HEPATIC FUNCTION PANEL (6)
ALT: 5 IU/L (ref 0–44)
AST: 21 IU/L (ref 0–40)
Alkaline Phosphatase: 60 IU/L (ref 44–121)
Bilirubin Total: 0.6 mg/dL (ref 0.0–1.2)
Bilirubin, Direct: 0.21 mg/dL (ref 0.00–0.40)

## 2021-09-21 LAB — CBC WITH DIFFERENTIAL/PLATELET
Basophils Absolute: 0.1 10*3/uL (ref 0.0–0.2)
Basos: 1 %
EOS (ABSOLUTE): 0.1 10*3/uL (ref 0.0–0.4)
Eos: 1 %
Hematocrit: 41.3 % (ref 37.5–51.0)
Hemoglobin: 14.3 g/dL (ref 13.0–17.7)
Immature Grans (Abs): 0 10*3/uL (ref 0.0–0.1)
Immature Granulocytes: 0 %
Lymphocytes Absolute: 1.3 10*3/uL (ref 0.7–3.1)
Lymphs: 20 %
MCH: 31.8 pg (ref 26.6–33.0)
MCHC: 34.6 g/dL (ref 31.5–35.7)
MCV: 92 fL (ref 79–97)
Monocytes Absolute: 0.6 10*3/uL (ref 0.1–0.9)
Monocytes: 9 %
Neutrophils Absolute: 4.3 10*3/uL (ref 1.4–7.0)
Neutrophils: 69 %
Platelets: 238 10*3/uL (ref 150–450)
RBC: 4.5 x10E6/uL (ref 4.14–5.80)
RDW: 13.1 % (ref 11.6–15.4)
WBC: 6.3 10*3/uL (ref 3.4–10.8)

## 2021-09-21 LAB — RENAL FUNCTION PANEL
Albumin: 4.5 g/dL (ref 3.9–4.9)
BUN/Creatinine Ratio: 7 — ABNORMAL LOW (ref 10–24)
BUN: 4 mg/dL — ABNORMAL LOW (ref 8–27)
CO2: 23 mmol/L (ref 20–29)
Calcium: 9.6 mg/dL (ref 8.6–10.2)
Chloride: 93 mmol/L — ABNORMAL LOW (ref 96–106)
Creatinine, Ser: 0.55 mg/dL — ABNORMAL LOW (ref 0.76–1.27)
Glucose: 104 mg/dL — ABNORMAL HIGH (ref 70–99)
Phosphorus: 3.7 mg/dL (ref 2.8–4.1)
Potassium: 4.7 mmol/L (ref 3.5–5.2)
Sodium: 134 mmol/L (ref 134–144)
eGFR: 112 mL/min/{1.73_m2} (ref >59)

## 2021-09-21 LAB — THYROID PANEL WITH TSH
Free Thyroxine Index: 1.3 (ref 1.2–4.9)
T3 Uptake Ratio: 23 % — ABNORMAL LOW (ref 24–39)
T4, Total: 5.6 ug/dL (ref 4.5–12.0)
TSH: 1.87 u[IU]/mL (ref 0.450–4.500)

## 2021-09-21 LAB — HIV ANTIBODY (ROUTINE TESTING W REFLEX): HIV Screen 4th Generation wRfx: NONREACTIVE

## 2021-09-21 LAB — HEPATITIS C ANTIBODY: Hep C Virus Ab: NONREACTIVE

## 2021-09-26 ENCOUNTER — Telehealth: Payer: Self-pay | Admitting: Family Medicine

## 2021-09-26 NOTE — Telephone Encounter (Signed)
Copied from CRM [id number]. Topic: General - Inquiry >> Sep 26, 2021  8:40 AM De Blanch wrote: Reason for CRM: Pt is requesting shingles vaccine at upcoming appointment.  Pt stated Dr.Jones advised him to call insurance and double check that they would cover it. Pt has confirmed with insurance and they will cover.

## 2021-10-12 ENCOUNTER — Other Ambulatory Visit: Payer: Self-pay | Admitting: Family Medicine

## 2021-10-12 DIAGNOSIS — F32A Depression, unspecified: Secondary | ICD-10-CM

## 2021-10-12 NOTE — Telephone Encounter (Signed)
Requested medication (s) are due for refill today:no, would like 90 supply  Requested medication (s) are on the active medication list: yes  Last refill:  09/20/21  Future visit scheduled: yes  Notes to clinic:  Unable to refill per protocol, last refill by provider 09/20/21 for 30, 2 RF. Pharmacy request 90 day supply, routing for review     Requested Prescriptions  Pending Prescriptions Disp Refills   sertraline (ZOLOFT) 25 MG tablet [Pharmacy Med Name: SERTRALINE HCL 25 MG TABLET] 90 tablet 2    Sig: Take 1 tablet (25 mg total) by mouth daily.     Psychiatry:  Antidepressants - SSRI - sertraline Passed - 10/12/2021  8:30 AM      Passed - AST in normal range and within 360 days    AST  Date Value Ref Range Status  09/20/2021 21 0 - 40 IU/L Final         Passed - ALT in normal range and within 360 days    ALT  Date Value Ref Range Status  09/20/2021 5 0 - 44 IU/L Final         Passed - Completed PHQ-2 or PHQ-9 in the last 360 days      Passed - Valid encounter within last 6 months    Recent Outpatient Visits           3 weeks ago Primary hypertension   [facility], MD       Future Appointments             In 2 weeks Duanne Limerick, MD [facility]

## 2021-11-01 ENCOUNTER — Encounter: Payer: 59 | Admitting: Family Medicine

## 2021-11-05 ENCOUNTER — Ambulatory Visit: Payer: Self-pay | Admitting: *Deleted

## 2021-11-05 ENCOUNTER — Encounter: Payer: 59 | Admitting: Family Medicine

## 2021-11-05 NOTE — Telephone Encounter (Signed)
Message from Luciana Axe sent at 11/05/2021  2:40 PM EDT  Summary: Diarrhea medication advice   Pt is calling to report that he had a cpe today. Missed it due to diarrhea for 3 days. Pt reports taking antidiarrheal medicaitons with persistent diarrhea. Please advise           Call History   Type Contact Phone/Fax User  11/05/2021 02:39 PM EDT Phone (Incoming) Luana Shu, Coralie Carpen. (Self) [phone] Lemmie Evens) Luciana Axe

## 2021-11-05 NOTE — Telephone Encounter (Signed)
Attempted to return his call.   Left voicemail to call back to discuss symptoms of diarrhea with a nurse.

## 2022-01-08 ENCOUNTER — Encounter: Payer: Self-pay | Admitting: Family Medicine

## 2022-01-08 ENCOUNTER — Ambulatory Visit (INDEPENDENT_AMBULATORY_CARE_PROVIDER_SITE_OTHER): Payer: 59 | Admitting: Family Medicine

## 2022-01-08 VITALS — BP 160/108 | HR 96 | Ht 72.0 in | Wt 182.0 lb

## 2022-01-08 DIAGNOSIS — I10 Essential (primary) hypertension: Secondary | ICD-10-CM

## 2022-01-08 DIAGNOSIS — F419 Anxiety disorder, unspecified: Secondary | ICD-10-CM | POA: Diagnosis not present

## 2022-01-08 DIAGNOSIS — F32A Depression, unspecified: Secondary | ICD-10-CM

## 2022-01-08 DIAGNOSIS — Z23 Encounter for immunization: Secondary | ICD-10-CM | POA: Diagnosis not present

## 2022-01-08 DIAGNOSIS — A630 Anogenital (venereal) warts: Secondary | ICD-10-CM | POA: Diagnosis not present

## 2022-01-08 DIAGNOSIS — R69 Illness, unspecified: Secondary | ICD-10-CM | POA: Diagnosis not present

## 2022-01-08 MED ORDER — AMLODIPINE BESYLATE 5 MG PO TABS: 5.0000 mg | ORAL_TABLET | Freq: Every day | ORAL | 0 refills | Status: DC

## 2022-01-08 MED ORDER — LISINOPRIL-HYDROCHLOROTHIAZIDE 20-12.5 MG PO TABS: 1.0000 | ORAL_TABLET | Freq: Every morning | ORAL | 0 refills | Status: DC

## 2022-01-08 MED ORDER — METOPROLOL SUCCINATE ER 25 MG PO TB24: 25.0000 mg | ORAL_TABLET | Freq: Every day | ORAL | 0 refills | Status: DC

## 2022-01-08 MED ORDER — SERTRALINE HCL 25 MG PO TABS: 25.0000 mg | ORAL_TABLET | Freq: Every day | ORAL | 0 refills | Status: DC

## 2022-01-08 NOTE — Progress Notes (Signed)
Date:  01/08/2022   Name:  Michael Dalton.   DOB:  1960-04-22   MRN:  [id number]   Chief Complaint: Annual Exam, Flu Vaccine, and Hypertension  Hypertension This is a chronic problem. The current episode started more than 1 year ago. The problem has been gradually worsening since onset. The problem is uncontrolled. Pertinent negatives include no blurred vision, chest pain, headaches, neck pain, orthopnea, palpitations, peripheral edema, PND or shortness of breath. There are no associated agents to hypertension. Past treatments include ACE inhibitors, diuretics, calcium channel blockers and beta blockers. The current treatment provides moderate improvement. There are no compliance problems.  There is no history of angina, kidney disease, CAD/MI, CVA, heart failure, left ventricular hypertrophy, PVD or retinopathy. There is no history of chronic renal disease, a hypertension causing med or renovascular disease.  Depression        This is a chronic problem.  The current episode started more than 1 year ago.   The problem has been gradually improving since onset.  Associated symptoms include no decreased concentration, no fatigue, no helplessness, no hopelessness, does not have insomnia, not irritable, no restlessness, no decreased interest, no appetite change, no body aches, no myalgias, no headaches, no indigestion, not sad and no suicidal ideas.     The symptoms are aggravated by work stress.   Lab Results  Component Value Date   NA 134 09/20/2021   K 4.7 09/20/2021   CO2 23 09/20/2021   GLUCOSE 104 (H) 09/20/2021   BUN 4 (L) 09/20/2021   CREATININE 0.55 (L) 09/20/2021   CALCIUM 9.6 09/20/2021   EGFR 112 09/20/2021   No results found for: "CHOL", "HDL", "LDLCALC", "LDLDIRECT", "TRIG", "CHOLHDL" Lab Results  Component Value Date   TSH 1.870 09/20/2021   No results found for: "HGBA1C" Lab Results  Component Value Date   WBC 6.3 09/20/2021   HGB 14.3 09/20/2021   HCT 41.3  09/20/2021   MCV 92 09/20/2021   PLT 238 09/20/2021   Lab Results  Component Value Date   ALT 5 09/20/2021   AST 21 09/20/2021   ALKPHOS 60 09/20/2021   BILITOT 0.6 09/20/2021   No results found for: "25OHVITD2", "25OHVITD3", "VD25OH"   Review of Systems  Constitutional:  Negative for appetite change, chills, fatigue and fever.  HENT:  Negative for drooling, ear discharge, ear pain and sore throat.   Eyes:  Negative for blurred vision.  Respiratory:  Negative for cough, shortness of breath and wheezing.   Cardiovascular:  Negative for chest pain, palpitations, orthopnea, leg swelling and PND.  Gastrointestinal:  Negative for abdominal pain, blood in stool, constipation, diarrhea and nausea.  Endocrine: Negative for polydipsia.  Genitourinary:  Negative for dysuria, frequency, hematuria and urgency.  Musculoskeletal:  Negative for back pain, myalgias and neck pain.  Skin:  Negative for rash.  Allergic/Immunologic: Negative for environmental allergies.  Neurological:  Negative for dizziness and headaches.  Hematological:  Does not bruise/bleed easily.  Psychiatric/Behavioral:  Positive for depression. Negative for decreased concentration and suicidal ideas. The patient is not nervous/anxious and does not have insomnia.     There are no problems to display for this patient.   No Known Allergies  No past surgical history on file.  Social History   Tobacco Use   Smokeless tobacco: Never  Vaping Use   Vaping Use: Never used  Substance Use Topics   Alcohol use: Yes    Alcohol/week: 3.0 standard drinks of alcohol  Types: 3 Cans of beer per week    Comment: 3 beers/ daily   Drug use: Never     Medication list has been reviewed and updated.  Current Meds  Medication Sig   amLODipine (NORVASC) 5 MG tablet Take 1 tablet (5 mg total) by mouth daily.   hydrocortisone (ANUSOL-HC) 2.5 % rectal cream Place 1 Application rectally 2 (two) times daily.   sertraline (ZOLOFT) 25  MG tablet TAKE 1 TABLET (25 MG TOTAL) BY MOUTH DAILY.       01/08/2022   11:09 AM 09/20/2021    1:55 PM  GAD 7 : Generalized Anxiety Score  Nervous, Anxious, on Edge 0 2  Control/stop worrying 0 2  Worry too much - different things 0 2  Trouble relaxing 0 2  Restless 0 2  Easily annoyed or irritable 0 2  Afraid - awful might happen 0 0  Total GAD 7 Score 0 12  Anxiety Difficulty Not difficult at all Not difficult at all       01/08/2022   11:09 AM 09/20/2021    1:55 PM  Depression screen PHQ 2/9  Decreased Interest 0 2  Down, Depressed, Hopeless 0 1  PHQ - 2 Score 0 3  Altered sleeping 0 0  Tired, decreased energy 0 2  Change in appetite 0 0  Feeling bad or failure about yourself  0 2  Trouble concentrating 0 0  Moving slowly or fidgety/restless 0 0  Suicidal thoughts 0 0  PHQ-9 Score 0 7  Difficult doing work/chores Not difficult at all Not difficult at all    BP Readings from Last 3 Encounters:  01/08/22 (!) 160/108  09/20/21 (!) 154/98  05/16/16 (!) 144/94    Physical Exam Vitals and nursing note reviewed.  Constitutional:      General: He is not irritable. HENT:     Head: Normocephalic.     Right Ear: External ear normal.     Left Ear: External ear normal.     Nose: Nose normal.  Eyes:     General: No scleral icterus.       Right eye: No discharge.        Left eye: No discharge.     Conjunctiva/sclera: Conjunctivae normal.     Pupils: Pupils are equal, round, and reactive to light.  Neck:     Thyroid: No thyromegaly.     Vascular: No JVD.     Trachea: No tracheal deviation.  Cardiovascular:     Rate and Rhythm: Normal rate and regular rhythm.     Heart sounds: Normal heart sounds. No murmur heard.    No friction rub. No gallop.  Pulmonary:     Effort: No respiratory distress.     Breath sounds: Normal breath sounds. No wheezing or rales.  Abdominal:     General: Bowel sounds are normal.     Palpations: Abdomen is soft. There is no mass.      Tenderness: There is no abdominal tenderness. There is no guarding or rebound.  Musculoskeletal:        General: No tenderness. Normal range of motion.     Cervical back: Normal range of motion and neck supple.  Lymphadenopathy:     Cervical: No cervical adenopathy.  Skin:    General: Skin is warm.     Findings: No rash.  Neurological:     Mental Status: He is alert and oriented to person, place, and time.     Cranial Nerves: No  cranial nerve deficit.     Deep Tendon Reflexes: Reflexes are normal and symmetric.     Wt Readings from Last 3 Encounters:  01/08/22 182 lb (82.6 kg)  09/20/21 189 lb (85.7 kg)  05/16/16 210 lb (95.3 kg)    BP (!) 160/108 (BP Location: Left Arm, Cuff Size: Large)   Pulse 96   Ht 6' (1.829 m)   Wt 182 lb (82.6 kg)   SpO2 97%   BMI 24.68 kg/m   Assessment and Plan:  1. Primary hypertension Chronic.  Uncontrolled.  Stable.  On last visit patient will had amlodipine 5 mg added to his regimen but was supposed to continue his lisinopril hydrochlorothiazide 20-12.5 mg and metoprolol XL 25.  However he did not continue these medications and only comes a day on amlodipine and blood pressure was elevated to 160/100 8.  We will resume all 3 medications and we will recheck in 6 weeks. - amLODipine (NORVASC) 5 MG tablet; Take 1 tablet (5 mg total) by mouth daily.  Dispense: 90 tablet; Refill: 0 - lisinopril-hydrochlorothiazide (ZESTORETIC) 20-12.5 MG tablet; Take 1 tablet by mouth every morning.  Dispense: 90 tablet; Refill: 0 - metoprolol succinate (TOPROL-XL) 25 MG 24 hr tablet; Take 1 tablet (25 mg total) by mouth daily.  Dispense: 90 tablet; Refill: 0  2. Condyloma New onset.  There is no mention in the colonoscopy that patient had condyloma or skin tag or external hemorrhoids however there is a large area that looks to be either of a condyloma nature or skin tag is swollen.  We will refer to gastroenterology for evaluation and determination as to the etiology  in the meantime we will check HIV and hep C. - HIV Antibody (routine testing w rflx) - Hepatitis C Antibody - Ambulatory referral to Gastroenterology  3. Anxiety and depression Chronic.  Controlled.  Stable.  PHQ is 0.  GAD score is 0.  Continue sertraline 25 mg once a day. - sertraline (ZOLOFT) 25 MG tablet; Take 1 tablet (25 mg total) by mouth daily.  Dispense: 90 tablet; Refill: 0  4. Need for immunization against influenza Discussed and administered - Flu Vaccine QUAD 24moIM (Fluarix, Fluzone & Alfiuria Quad PF)    DOtilio Miu MD

## 2022-01-09 LAB — HEPATITIS C ANTIBODY: Hep C Virus Ab: NONREACTIVE

## 2022-01-09 LAB — HIV ANTIBODY (ROUTINE TESTING W REFLEX): HIV Screen 4th Generation wRfx: NONREACTIVE

## 2022-01-14 DIAGNOSIS — K6282 Dysplasia of anus: Secondary | ICD-10-CM | POA: Diagnosis not present

## 2022-01-16 LAB — SURGICAL PATHOLOGY

## 2022-01-17 ENCOUNTER — Other Ambulatory Visit: Payer: Self-pay | Admitting: Family Medicine

## 2022-01-17 DIAGNOSIS — I10 Essential (primary) hypertension: Secondary | ICD-10-CM

## 2022-01-17 NOTE — Telephone Encounter (Signed)
Rx was sent to pharmacy on 01/08/22 #90/0.   Requested Prescriptions  Refused Prescriptions Disp Refills   lisinopril-hydrochlorothiazide (ZESTORETIC) 20-12.5 MG tablet 90 tablet 0    Sig: Take 1 tablet by mouth every morning.     Cardiovascular:  ACEI + Diuretic Combos Failed - 01/17/2022 12:38 PM      Failed - Cr in normal range and within 180 days    Creatinine, Ser  Date Value Ref Range Status  09/20/2021 0.55 (L) 0.76 - 1.27 mg/dL Final         Failed - Last BP in normal range    BP Readings from Last 1 Encounters:  01/08/22 (!) 160/108         Passed - Na in normal range and within 180 days    Sodium  Date Value Ref Range Status  09/20/2021 134 134 - 144 mmol/L Final         Passed - K in normal range and within 180 days    Potassium  Date Value Ref Range Status  09/20/2021 4.7 3.5 - 5.2 mmol/L Final         Passed - eGFR is 30 or above and within 180 days    eGFR  Date Value Ref Range Status  09/20/2021 112 >59 mL/min/1.73 Final         Passed - Patient is not pregnant      Passed - Valid encounter within last 6 months    Recent Outpatient Visits           1 week ago Primary hypertension   [facility] [place], [place], [place]   3 months ago Primary hypertension   [facility] [place], [place], [place]       Future Appointments             In 1 week Juline Patch, MD [facility] at [facility], [place]   In 1 month Juline Patch, MD [facility] at [facility], [place]   In 1 month Juline Patch, MD [facility] at [facility], [place]   In 12 months Juline Patch, MD [facility] at [facility]

## 2022-01-17 NOTE — Telephone Encounter (Signed)
Pt called to report that he actually has his metoprolol but he still needs his lisinopril.

## 2022-01-17 NOTE — Telephone Encounter (Unsigned)
Copied from CRM [id number]. Topic: General - Other >> Jan 17, 2022 11:59 AM Everette C wrote: Reason for CRM: Medication Refill - Medication: lisinopril-hydrochlorothiazide (ZESTORETIC) 20-12.5 MG tablet [[id number]]  metoprolol succinate (TOPROL-XL) 25 MG 24 hr tablet [[id number]]  Has the patient contacted their pharmacy? Yes.  The patient has been directed to contact their PCP  (Agent: If no, request that the patient contact the pharmacy for the refill. If patient does not wish to contact the pharmacy document the reason why and proceed with request.) (Agent: If yes, when and what did the pharmacy advise?)  Preferred Pharmacy (with phone number or street name): [id number] [place], [place] - [address] [place] [address]Phone: [phone] Fax: [phone]: Not open 24 hours   Has the patient been seen for an appointment in the last year OR does the patient have an upcoming appointment? Yes.    Agent: Please be advised that RX refills may take up to 3 business days. We ask that you follow-up with your pharmacy.

## 2022-01-22 ENCOUNTER — Other Ambulatory Visit: Payer: Self-pay | Admitting: *Deleted

## 2022-01-22 DIAGNOSIS — F17210 Nicotine dependence, cigarettes, uncomplicated: Secondary | ICD-10-CM

## 2022-01-22 DIAGNOSIS — Z87891 Personal history of nicotine dependence: Secondary | ICD-10-CM

## 2022-01-22 DIAGNOSIS — Z122 Encounter for screening for malignant neoplasm of respiratory organs: Secondary | ICD-10-CM

## 2022-01-24 ENCOUNTER — Encounter: Payer: Self-pay | Admitting: Family Medicine

## 2022-01-24 ENCOUNTER — Ambulatory Visit (INDEPENDENT_AMBULATORY_CARE_PROVIDER_SITE_OTHER): Payer: 59 | Admitting: Family Medicine

## 2022-01-24 VITALS — BP 124/84 | HR 80 | Ht 72.0 in | Wt 190.0 lb

## 2022-01-24 DIAGNOSIS — Z0000 Encounter for general adult medical examination without abnormal findings: Secondary | ICD-10-CM | POA: Diagnosis not present

## 2022-01-24 DIAGNOSIS — Z23 Encounter for immunization: Secondary | ICD-10-CM

## 2022-01-24 NOTE — Patient Instructions (Signed)

## 2022-01-24 NOTE — Progress Notes (Signed)
Date:  01/24/2022   Name:  Michael Dalton.   DOB:  [birth year]   MRN:  [id number]   Chief Complaint: Annual Exam  Patient is a 60 year old male who presents for a comprehensive physical exam. The patient reports the following problems: none. Health maintenance has been reviewed up to date      Lab Results  Component Value Date   NA 134 09/20/2021   K 4.7 09/20/2021   CO2 23 09/20/2021   GLUCOSE 104 (H) 09/20/2021   BUN 4 (L) 09/20/2021   CREATININE 0.55 (L) 09/20/2021   CALCIUM 9.6 09/20/2021   EGFR 112 09/20/2021   No results found for: "CHOL", "HDL", "LDLCALC", "LDLDIRECT", "TRIG", "CHOLHDL" Lab Results  Component Value Date   TSH 1.870 09/20/2021   No results found for: "HGBA1C" Lab Results  Component Value Date   WBC 6.3 09/20/2021   HGB 14.3 09/20/2021   HCT 41.3 09/20/2021   MCV 92 09/20/2021   PLT 238 09/20/2021   Lab Results  Component Value Date   ALT 5 09/20/2021   AST 21 09/20/2021   ALKPHOS 60 09/20/2021   BILITOT 0.6 09/20/2021   No results found for: "25OHVITD2", "25OHVITD3", "VD25OH"   Review of Systems  Constitutional:  Negative for chills and fever.  HENT:  Negative for drooling, ear discharge, ear pain, sore throat and trouble swallowing.   Respiratory:  Negative for cough, shortness of breath and wheezing.   Cardiovascular:  Negative for chest pain, palpitations and leg swelling.  Gastrointestinal:  Negative for abdominal pain, blood in stool, constipation, diarrhea and nausea.  Endocrine: Negative for polydipsia.  Genitourinary:  Negative for difficulty urinating, dysuria, frequency, hematuria and urgency.  Musculoskeletal:  Negative for back pain, myalgias and neck pain.  Skin:  Negative for rash.  Allergic/Immunologic: Negative for environmental allergies.  Neurological:  Negative for dizziness and headaches.  Hematological:  Does not bruise/bleed easily.  Psychiatric/Behavioral:  Negative for suicidal ideas. The patient is  not nervous/anxious.     There are no problems to display for this patient.   No Known Allergies  No past surgical history on file.  Social History   Tobacco Use   Smokeless tobacco: Never  Vaping Use   Vaping Use: Never used  Substance Use Topics   Alcohol use: Yes    Alcohol/week: 3.0 standard drinks of alcohol    Types: 3 Cans of beer per week    Comment: 3 beers/ daily   Drug use: Never     Medication list has been reviewed and updated.  Current Meds  Medication Sig   amLODipine (NORVASC) 5 MG tablet Take 1 tablet (5 mg total) by mouth daily.   hydrocortisone (ANUSOL-HC) 2.5 % rectal cream Place 1 Application rectally 2 (two) times daily.   lisinopril-hydrochlorothiazide (ZESTORETIC) 20-12.5 MG tablet Take 1 tablet by mouth every morning.   metoprolol succinate (TOPROL-XL) 25 MG 24 hr tablet Take 1 tablet (25 mg total) by mouth daily.   sertraline (ZOLOFT) 25 MG tablet Take 1 tablet (25 mg total) by mouth daily.       01/24/2022    8:26 AM 01/08/2022   11:09 AM 09/20/2021    1:55 PM  GAD 7 : Generalized Anxiety Score  Nervous, Anxious, on Edge 0 0 2  Control/stop worrying 0 0 2  Worry too much - different things 0 0 2  Trouble relaxing 0 0 2  Restless 0 0 2  Easily annoyed or irritable 0  0 2  Afraid - awful might happen 0 0 0  Total GAD 7 Score 0 0 12  Anxiety Difficulty Not difficult at all Not difficult at all Not difficult at all       01/24/2022    8:26 AM 01/08/2022   11:09 AM 09/20/2021    1:55 PM  Depression screen PHQ 2/9  Decreased Interest 0 0 2  Down, Depressed, Hopeless 0 0 1  PHQ - 2 Score 0 0 3  Altered sleeping 0 0 0  Tired, decreased energy 0 0 2  Change in appetite 0 0 0  Feeling bad or failure about yourself  0 0 2  Trouble concentrating 0 0 0  Moving slowly or fidgety/restless 0 0 0  Suicidal thoughts 0 0 0  PHQ-9 Score 0 0 7  Difficult doing work/chores Not difficult at all Not difficult at all Not difficult at all    BP  Readings from Last 3 Encounters:  01/24/22 124/84  01/08/22 (!) 160/108  09/20/21 (!) 154/98    Physical Exam Vitals and nursing note reviewed.  HENT:     Head: Normocephalic.     Jaw: There is normal jaw occlusion.     Right Ear: Hearing, tympanic membrane, ear canal and external ear normal.     Left Ear: Hearing, tympanic membrane, ear canal and external ear normal.     Nose: Nose normal.     Mouth/Throat:     Lips: Pink.     Mouth: Mucous membranes are moist.     Dentition: Normal dentition.     Tongue: No lesions.     Palate: No mass.     Pharynx: Oropharynx is clear. Uvula midline. No pharyngeal swelling.  Eyes:     General: Lids are normal. Vision grossly intact. Gaze aligned appropriately. No scleral icterus.       Right eye: No discharge.        Left eye: No discharge.     Conjunctiva/sclera: Conjunctivae normal.     Pupils: Pupils are equal, round, and reactive to light.     Funduscopic exam:    Right eye: Red reflex present.        Left eye: Red reflex present. Neck:     Thyroid: No thyroid mass, thyromegaly or thyroid tenderness.     Vascular: Normal carotid pulses. No carotid bruit, hepatojugular reflux or JVD.     Trachea: Trachea and phonation normal. No tracheal deviation.  Cardiovascular:     Rate and Rhythm: Normal rate and regular rhythm.     Pulses: Normal pulses.          Carotid pulses are 2+ on the right side and 2+ on the left side.      Radial pulses are 2+ on the right side and 2+ on the left side.       Femoral pulses are 2+ on the right side and 2+ on the left side.      Dorsalis pedis pulses are 2+ on the right side.       Posterior tibial pulses are 2+ on the right side and 2+ on the left side.     Heart sounds: Normal heart sounds, S1 normal and S2 normal. No murmur heard.    No systolic murmur is present.     No diastolic murmur is present.     No friction rub. No gallop. No S3 or S4 sounds.  Pulmonary:     Effort: Pulmonary effort is  normal.  No respiratory distress.     Breath sounds: Normal breath sounds. No decreased breath sounds, wheezing, rhonchi or rales.  Chest:  Breasts:    Breasts are symmetrical.     Right: Normal. No mass.     Left: Normal. No mass.  Abdominal:     General: Bowel sounds are normal.     Palpations: Abdomen is soft. There is no hepatomegaly, splenomegaly or mass.     Tenderness: There is no abdominal tenderness. There is no right CVA tenderness, left CVA tenderness, guarding or rebound.  Genitourinary:    Penis: Normal.      Testes: Normal.     Epididymis:     Right: Normal.  Musculoskeletal:        General: No tenderness. Normal range of motion.     Cervical back: Full passive range of motion without pain, normal range of motion and neck supple.     Right lower leg: No edema.     Left lower leg: No edema.  Lymphadenopathy:     Head:     Right side of head: No submandibular or tonsillar adenopathy.     Left side of head: No submandibular or tonsillar adenopathy.     Cervical: No cervical adenopathy.     Right cervical: No superficial, deep or posterior cervical adenopathy.    Left cervical: No superficial, deep or posterior cervical adenopathy.     Upper Body:     Right upper body: No supraclavicular or axillary adenopathy.     Left upper body: No supraclavicular or axillary adenopathy.     Lower Body: No right inguinal adenopathy. No left inguinal adenopathy.  Skin:    General: Skin is warm.     Capillary Refill: Capillary refill takes less than 2 seconds.     Findings: No rash.  Neurological:     Mental Status: He is alert and oriented to person, place, and time.     Cranial Nerves: Cranial nerves 2-12 are intact. No cranial nerve deficit.     Sensory: Sensation is intact.     Motor: Motor function is intact.     Deep Tendon Reflexes: Reflexes are normal and symmetric.     Reflex Scores:      Tricep reflexes are 2+ on the right side and 2+ on the left side.      Bicep  reflexes are 2+ on the right side and 2+ on the left side.      Brachioradialis reflexes are 2+ on the right side and 2+ on the left side.      Patellar reflexes are 2+ on the right side and 2+ on the left side.      Achilles reflexes are 2+ on the right side and 2+ on the left side. Psychiatric:        Behavior: Behavior is cooperative.     Wt Readings from Last 3 Encounters:  01/24/22 190 lb (86.2 kg)  01/08/22 182 lb (82.6 kg)  09/20/21 189 lb (85.7 kg)    BP 124/84 (BP Location: Right Arm, Cuff Size: Large)   Pulse 80   Ht 6' (1.829 m)   Wt 190 lb (86.2 kg)   SpO2 98%   BMI 25.77 kg/m   Assessment and Plan: Michael Dalton. is a 61 y.o. male who presents today for his Complete Annual Exam. He feels fairly well. He reports exercising walk alot. He reports he is sleeping poorly. Immunizations are reviewed and recommendations provided.   Age  appropriate screening tests are discussed. Counseling given for risk factor reduction interventions.   1. Annual physical exam No subjective/objective concerns noted during history of present illness, review of past medical history and medications, review of systems, and physical exam.  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  Will check PSA for evaluation of prostate as well as lipid panel. - PSA - Lipid Panel With LDL/HDL Ratio  2. Need for shingles vaccine Discussed and administered. - Zoster Recombinant (Shingrix )    Otilio Miu, MD

## 2022-01-25 LAB — LIPID PANEL WITH LDL/HDL RATIO
Cholesterol, Total: 190 mg/dL (ref 100–199)
HDL: 76 mg/dL (ref >39)
LDL Chol Calc (NIH): 96 mg/dL (ref 0–99)
LDL/HDL Ratio: 1.3 ratio (ref 0.0–3.6)
Triglycerides: 104 mg/dL (ref 0–149)
VLDL Cholesterol Cal: 18 mg/dL (ref 5–40)

## 2022-01-25 LAB — PSA: Prostate Specific Ag, Serum: 0.5 ng/mL (ref 0.0–4.0)

## 2022-01-28 DIAGNOSIS — K6289 Other specified diseases of anus and rectum: Secondary | ICD-10-CM | POA: Diagnosis not present

## 2022-01-28 DIAGNOSIS — R69 Illness, unspecified: Secondary | ICD-10-CM | POA: Diagnosis not present

## 2022-02-05 DIAGNOSIS — R69 Illness, unspecified: Secondary | ICD-10-CM | POA: Diagnosis not present

## 2022-02-05 DIAGNOSIS — K6289 Other specified diseases of anus and rectum: Secondary | ICD-10-CM | POA: Diagnosis not present

## 2022-02-15 DIAGNOSIS — K6289 Other specified diseases of anus and rectum: Secondary | ICD-10-CM | POA: Diagnosis not present

## 2022-02-19 ENCOUNTER — Encounter: Payer: Self-pay | Admitting: Family Medicine

## 2022-02-19 ENCOUNTER — Ambulatory Visit (INDEPENDENT_AMBULATORY_CARE_PROVIDER_SITE_OTHER): Payer: 59 | Admitting: Family Medicine

## 2022-02-19 VITALS — BP 140/102 | HR 108 | Ht 72.0 in | Wt 197.0 lb

## 2022-02-19 DIAGNOSIS — I10 Essential (primary) hypertension: Secondary | ICD-10-CM | POA: Diagnosis not present

## 2022-02-19 MED ORDER — AMLODIPINE BESYLATE 10 MG PO TABS: 10.0000 mg | ORAL_TABLET | Freq: Every day | ORAL | 0 refills | Status: DC

## 2022-02-19 MED ORDER — LOSARTAN POTASSIUM-HCTZ 100-25 MG PO TABS: 1.0000 | ORAL_TABLET | Freq: Every day | ORAL | 0 refills | Status: DC

## 2022-02-19 NOTE — Progress Notes (Signed)
Date:  02/19/2022   Name:  Michael Dalton.   DOB:  1961/02/04   MRN:  [id number]   Chief Complaint: Hypertension  Hypertension This is a chronic problem. The current episode started more than 1 year ago. The problem has been gradually improving since onset. The problem is uncontrolled. Pertinent negatives include no chest pain, headaches, neck pain, palpitations, PND or shortness of breath. Past treatments include ACE inhibitors, diuretics, calcium channel blockers and beta blockers. The current treatment provides moderate improvement. There are no compliance problems.  There is no history of angina, kidney disease, CAD/MI, CVA, heart failure, left ventricular hypertrophy, PVD or retinopathy. There is no history of chronic renal disease, a hypertension causing med or renovascular disease.    Lab Results  Component Value Date   NA 134 09/20/2021   K 4.7 09/20/2021   CO2 23 09/20/2021   GLUCOSE 104 (H) 09/20/2021   BUN 4 (L) 09/20/2021   CREATININE 0.55 (L) 09/20/2021   CALCIUM 9.6 09/20/2021   EGFR 112 09/20/2021   Lab Results  Component Value Date   CHOL 190 01/24/2022   HDL 76 01/24/2022   LDLCALC 96 01/24/2022   TRIG 104 01/24/2022   Lab Results  Component Value Date   TSH 1.870 09/20/2021   No results found for: "HGBA1C" Lab Results  Component Value Date   WBC 6.3 09/20/2021   HGB 14.3 09/20/2021   HCT 41.3 09/20/2021   MCV 92 09/20/2021   PLT 238 09/20/2021   Lab Results  Component Value Date   ALT 5 09/20/2021   AST 21 09/20/2021   ALKPHOS 60 09/20/2021   BILITOT 0.6 09/20/2021   No results found for: "25OHVITD2", "25OHVITD3", "VD25OH"   Review of Systems  Constitutional:  Negative for chills and fever.  HENT:  Negative for drooling, ear discharge, ear pain and sore throat.   Respiratory:  Negative for cough, shortness of breath and wheezing.   Cardiovascular:  Negative for chest pain, palpitations, leg swelling and PND.  Gastrointestinal:   Negative for abdominal pain, blood in stool, constipation, diarrhea and nausea.  Endocrine: Negative for polydipsia.  Genitourinary:  Negative for dysuria, frequency, hematuria and urgency.  Musculoskeletal:  Negative for back pain, myalgias and neck pain.  Skin:  Negative for rash.  Allergic/Immunologic: Negative for environmental allergies.  Neurological:  Negative for dizziness and headaches.  Hematological:  Does not bruise/bleed easily.  Psychiatric/Behavioral:  Negative for suicidal ideas. The patient is not nervous/anxious.     There are no problems to display for this patient.   No Known Allergies  No past surgical history on file.  Social History   Tobacco Use   Smoking status: Every Day    Packs/day: 1.00    Years: 31.00    Total pack years: 31.00    Types: Cigarettes   Smokeless tobacco: Never  Vaping Use   Vaping Use: Never used  Substance Use Topics   Alcohol use: Yes    Alcohol/week: 3.0 standard drinks of alcohol    Types: 3 Cans of beer per week    Comment: 3 beers/ daily   Drug use: Never     Medication list has been reviewed and updated.  Current Meds  Medication Sig   hydrocortisone (ANUSOL-HC) 2.5 % rectal cream Place 1 Application rectally 2 (two) times daily.   metoprolol succinate (TOPROL-XL) 25 MG 24 hr tablet Take 1 tablet (25 mg total) by mouth daily.   sertraline (ZOLOFT) 25 MG tablet  Take 1 tablet (25 mg total) by mouth daily.   [DISCONTINUED] amLODipine (NORVASC) 5 MG tablet Take 1 tablet (5 mg total) by mouth daily.   [DISCONTINUED] lisinopril-hydrochlorothiazide (ZESTORETIC) 20-12.5 MG tablet Take 1 tablet by mouth every morning.       01/24/2022    8:26 AM 01/08/2022   11:09 AM 09/20/2021    1:55 PM  GAD 7 : Generalized Anxiety Score  Nervous, Anxious, on Edge 0 0 2  Control/stop worrying 0 0 2  Worry too much - different things 0 0 2  Trouble relaxing 0 0 2  Restless 0 0 2  Easily annoyed or irritable 0 0 2  Afraid - awful  might happen 0 0 0  Total GAD 7 Score 0 0 12  Anxiety Difficulty Not difficult at all Not difficult at all Not difficult at all       02/19/2022   10:49 AM 01/24/2022    8:26 AM 01/08/2022   11:09 AM  Depression screen PHQ 2/9  Decreased Interest 0 0 0  Down, Depressed, Hopeless 0 0 0  PHQ - 2 Score 0 0 0  Altered sleeping 0 0 0  Tired, decreased energy 0 0 0  Change in appetite 0 0 0  Feeling bad or failure about yourself  0 0 0  Trouble concentrating 0 0 0  Moving slowly or fidgety/restless 0 0 0  Suicidal thoughts 0 0 0  PHQ-9 Score 0 0 0  Difficult doing work/chores Not difficult at all Not difficult at all Not difficult at all    BP Readings from Last 3 Encounters:  02/19/22 (!) 140/102  01/24/22 124/84  01/08/22 (!) 160/108    Physical Exam Vitals and nursing note reviewed.  HENT:     Head: Normocephalic.     Right Ear: Tympanic membrane, ear canal and external ear normal.     Left Ear: Tympanic membrane, ear canal and external ear normal.     Nose: Nose normal. No congestion.  Eyes:     General: No scleral icterus.       Right eye: No discharge.        Left eye: No discharge.     Conjunctiva/sclera: Conjunctivae normal.     Pupils: Pupils are equal, round, and reactive to light.  Neck:     Thyroid: No thyromegaly.     Vascular: No JVD.     Trachea: No tracheal deviation.  Cardiovascular:     Rate and Rhythm: Normal rate and regular rhythm.     Heart sounds: Normal heart sounds. No murmur heard.    No friction rub. No gallop.  Pulmonary:     Effort: No respiratory distress.     Breath sounds: Normal breath sounds. No wheezing, rhonchi or rales.  Abdominal:     General: Bowel sounds are normal.     Palpations: Abdomen is soft. There is no mass.     Tenderness: There is no abdominal tenderness. There is no guarding or rebound.  Musculoskeletal:        General: No tenderness. Normal range of motion.     Cervical back: Normal range of motion and neck  supple.  Lymphadenopathy:     Cervical: No cervical adenopathy.  Skin:    General: Skin is warm.     Findings: No rash.  Neurological:     Mental Status: He is alert.     Deep Tendon Reflexes: Reflexes are normal and symmetric.     Wt Readings  from Last 3 Encounters:  02/19/22 197 lb (89.4 kg)  01/24/22 190 lb (86.2 kg)  01/08/22 182 lb (82.6 kg)    BP (!) 140/102 (BP Location: Right Arm, Cuff Size: Large)   Pulse (!) 108   Ht 6' (1.829 m)   Wt 197 lb (89.4 kg)   SpO2 99%   BMI 26.72 kg/m   Assessment and Plan: 1. Primary hypertension Chronic.  Uncontrolled.  Improving but not at goal.  Blood pressure 140/102.  Asymptomatic.  Tolerating current medication regimen.  Will continue metoprolol at current dosing.  Due to half-life and dose change we will switch lisinopril hydrochlorothiazide to losartan hydrochlorothiazide 100-25 mg once a day for better half-life coverage.  Will also increase amlodipine to 10 mg once a day.  Will recheck blood pressure without having a cigarette prior to visit in 2 weeks. - amLODipine (NORVASC) 10 MG tablet; Take 1 tablet (10 mg total) by mouth daily.  Dispense: 90 tablet; Refill: 0 - losartan-hydrochlorothiazide (HYZAAR) 100-25 MG tablet; Take 1 tablet by mouth daily.  Dispense: 90 tablet; Refill: 0     Otilio Miu, MD

## 2022-02-26 DIAGNOSIS — Z79899 Other long term (current) drug therapy: Secondary | ICD-10-CM | POA: Diagnosis not present

## 2022-02-26 DIAGNOSIS — J45909 Unspecified asthma, uncomplicated: Secondary | ICD-10-CM | POA: Diagnosis not present

## 2022-02-26 DIAGNOSIS — C44590 Other specified malignant neoplasm of anal skin: Secondary | ICD-10-CM | POA: Diagnosis not present

## 2022-02-26 DIAGNOSIS — Z7982 Long term (current) use of aspirin: Secondary | ICD-10-CM | POA: Diagnosis not present

## 2022-02-26 DIAGNOSIS — I10 Essential (primary) hypertension: Secondary | ICD-10-CM | POA: Diagnosis not present

## 2022-02-26 DIAGNOSIS — E785 Hyperlipidemia, unspecified: Secondary | ICD-10-CM | POA: Diagnosis not present

## 2022-02-26 DIAGNOSIS — R69 Illness, unspecified: Secondary | ICD-10-CM | POA: Diagnosis not present

## 2022-02-26 DIAGNOSIS — D72819 Decreased white blood cell count, unspecified: Secondary | ICD-10-CM | POA: Diagnosis not present

## 2022-02-26 DIAGNOSIS — C210 Malignant neoplasm of anus, unspecified: Secondary | ICD-10-CM | POA: Diagnosis not present

## 2022-02-26 DIAGNOSIS — K6289 Other specified diseases of anus and rectum: Secondary | ICD-10-CM | POA: Diagnosis not present

## 2022-03-05 ENCOUNTER — Ambulatory Visit: Payer: 59 | Admitting: Family Medicine

## 2022-03-05 DIAGNOSIS — C210 Malignant neoplasm of anus, unspecified: Secondary | ICD-10-CM | POA: Insufficient documentation

## 2022-03-07 DIAGNOSIS — C210 Malignant neoplasm of anus, unspecified: Secondary | ICD-10-CM | POA: Diagnosis not present

## 2022-03-12 ENCOUNTER — Encounter: Payer: Self-pay | Admitting: Acute Care

## 2022-03-12 ENCOUNTER — Ambulatory Visit (INDEPENDENT_AMBULATORY_CARE_PROVIDER_SITE_OTHER): Payer: 59 | Admitting: Acute Care

## 2022-03-12 DIAGNOSIS — F17210 Nicotine dependence, cigarettes, uncomplicated: Secondary | ICD-10-CM | POA: Diagnosis not present

## 2022-03-12 DIAGNOSIS — R69 Illness, unspecified: Secondary | ICD-10-CM | POA: Diagnosis not present

## 2022-03-12 NOTE — Patient Instructions (Signed)
Thank you for participating in the [facility]. It was our pleasure to meet you today. We will call you with the results of your scan within the next few days. Your scan will be assigned a Lung RADS category score by the physicians reading the scans.  This Lung RADS score determines follow up scanning.  See below for description of categories, and follow up screening recommendations. We will be in touch to schedule your follow up screening annually or based on recommendations of our providers. We will fax a copy of your scan results to your Primary Care Physician, or the physician who referred you to the program, to ensure they have the results. Please call the office if you have any questions or concerns regarding your scanning experience or results.  Our office number is [phone]. Please speak with Denise Phelps, RN. , or  Denise Buckner RN, They are  our Lung Cancer Screening RN.'s If They are unavailable when you call, Please leave a message on the voice mail. We will return your call at our earliest convenience.This voice mail is monitored several times a day.  Remember, if your scan is normal, we will scan you annually as long as you continue to meet the criteria for the program. (Age 50-80, Current smoker or smoker who has quit within the last 15 years). If you are a smoker, remember, quitting is the single most powerful action that you can take to decrease your risk of lung cancer and other pulmonary, breathing related problems. We know quitting is hard, and we are here to help.  Please let us know if there is anything we can do to help you meet your goal of quitting. If you are a former smoker, congratulations. We are proud of you! Remain smoke free! Remember you can refer friends or family members through the number above.  We will screen them to make sure they meet criteria for the program. Thank you for helping us take better care of you by  participating in Lung Screening.  You can receive free nicotine replacement therapy ( patches, gum or mints) by calling 1-800-QUIT NOW. Please call so we can get you on the path to becoming  a non-smoker. I know it is hard, but you can do this!  Lung RADS Categories:  Lung RADS 1: no nodules or definitely non-concerning nodules.  Recommendation is for a repeat annual scan in 12 months.  Lung RADS 2:  nodules that are non-concerning in appearance and behavior with a very low likelihood of becoming an active cancer. Recommendation is for a repeat annual scan in 12 months.  Lung RADS 3: nodules that are probably non-concerning , includes nodules with a low likelihood of becoming an active cancer.  Recommendation is for a 6-month repeat screening scan. Often noted after an upper respiratory illness. We will be in touch to make sure you have no questions, and to schedule your 6-month scan.  Lung RADS 4 A: nodules with concerning findings, recommendation is most often for a follow up scan in 3 months or additional testing based on our provider's assessment of the scan. We will be in touch to make sure you have no questions and to schedule the recommended 3 month follow up scan.  Lung RADS 4 B:  indicates findings that are concerning. We will be in touch with you to schedule additional diagnostic testing based on our provider's  assessment of the scan.  Other options for assistance in smoking cessation (   As covered by your insurance benefits)  Hypnosis for smoking cessation  [employer]. [phone]  Acupuncture for smoking cessation  [facility] [phone]

## 2022-03-12 NOTE — Progress Notes (Signed)
Virtual Visit via Telephone Note  I connected with Bradly Chris. on 03/12/22 at  3:00 PM EST by telephone and verified that I am speaking with the correct person using two identifiers.  Location: Patient:  At home Provider:  [place], [place], [place], [address]    I discussed the limitations, risks, security and privacy concerns of performing an evaluation and management service by telephone and the availability of in person appointments. I also discussed with the patient that there may be a patient responsible charge related to this service. The patient expressed understanding and agreed to proceed.   Shared Decision Making Visit Lung Cancer Screening Program [phone])   Eligibility: Age 62 y.o. Pack Years Smoking History Calculation 30 pack year smoking history (# packs/per year x # years smoked) Recent History of coughing up blood  no Unexplained weight loss? no ( >Than 15 pounds within the last 6 months ) Prior History Lung / other cancer no (Diagnosis within the last 5 years already requiring surveillance chest CT Scans). Smoking Status Current Smoker Former Smokers: Years since quit:  NA  Quit Date:  NA  Visit Components: Discussion included one or more decision making aids. yes Discussion included risk/benefits of screening. yes Discussion included potential follow up diagnostic testing for abnormal scans. yes Discussion included meaning and risk of over diagnosis. yes Discussion included meaning and risk of False Positives. yes Discussion included meaning of total radiation exposure. yes  Counseling Included: Importance of adherence to annual lung cancer LDCT screening. yes Impact of comorbidities on ability to participate in the program. yes Ability and willingness to under diagnostic treatment. yes  Smoking Cessation Counseling: Current Smokers:  Discussed importance of smoking cessation. yes Information about tobacco cessation classes and  interventions provided to patient. yes Patient provided with "ticket" for LDCT Scan. yes Symptomatic Patient. no  Counseling NA Diagnosis Code: Tobacco Use Z72.0 Asymptomatic Patient yes  Counseling (Intermediate counseling: > three minutes counseling) [id number] Former Smokers:  Discussed the importance of maintaining cigarette abstinence. yes Diagnosis Code: Personal History of Nicotine Dependence. G99.242 Information about tobacco cessation classes and interventions provided to patient. Yes Patient provided with "ticket" for LDCT Scan. yes Written Order for Lung Cancer Screening with LDCT placed in Epic. Yes (CT Chest Lung Cancer Screening Low Dose W/O CM) AST4196 Z12.2-Screening of respiratory organs Z87.891-Personal history of nicotine dependence  I have spent 25 minutes of face to face/ virtual visit   time with  Mr. Bair discussing the risks and benefits of lung cancer screening. We viewed / discussed a power point together that explained in detail the above noted topics. We paused at intervals to allow for questions to be asked and answered to ensure understanding.We discussed that the single most powerful action that he can take to decrease his risk of developing lung cancer is to quit smoking. We discussed whether or not he is ready to commit to setting a quit date. We discussed options for tools to aid in quitting smoking including nicotine replacement therapy, non-nicotine medications, support groups, Quit Smart classes, and behavior modification. We discussed that often times setting smaller, more achievable goals, such as eliminating 1 cigarette a day for a week and then 2 cigarettes a day for a week can be helpful in slowly decreasing the number of cigarettes smoked. This allows for a sense of accomplishment as well as providing a clinical benefit. I provided  him  with smoking cessation  information  with contact information for community  resources, classes, free nicotine replacement  therapy, and access to mobile apps, text messaging, and on-line smoking cessation help. I have also provided  him  the office contact information in the event he needs to contact me, or the screening staff. We discussed the time and location of the scan, and that either Doroteo Glassman RN, Joella Prince, RN  or I will call / send a letter with the results within 24-72 hours of receiving them. The patient verbalized understanding of all of  the above and had no further questions upon leaving the office. They have my contact information in the event they have any further questions.  I spent 3 minutes counseling on smoking cessation and the health risks of continued tobacco abuse.  I explained to the patient that there has been a high incidence of coronary artery disease noted on these exams. I explained that this is a non-gated exam therefore degree or severity cannot be determined. This patient is not on statin therapy. I have asked the patient to follow-up with their PCP regarding any incidental finding of coronary artery disease and management with diet or medication as their PCP  feels is clinically indicated. The patient verbalized understanding of the above and had no further questions upon completion of the visit.      Magdalen Spatz, NP 03/12/2022

## 2022-03-13 ENCOUNTER — Inpatient Hospital Stay: Admission: RE | Admit: 2022-03-13 | Payer: BLUE CROSS/BLUE SHIELD | Source: Ambulatory Visit

## 2022-03-27 DIAGNOSIS — R918 Other nonspecific abnormal finding of lung field: Secondary | ICD-10-CM | POA: Diagnosis not present

## 2022-03-27 DIAGNOSIS — E279 Disorder of adrenal gland, unspecified: Secondary | ICD-10-CM | POA: Diagnosis not present

## 2022-03-27 DIAGNOSIS — K629 Disease of anus and rectum, unspecified: Secondary | ICD-10-CM | POA: Diagnosis not present

## 2022-03-27 DIAGNOSIS — C210 Malignant neoplasm of anus, unspecified: Secondary | ICD-10-CM | POA: Diagnosis not present

## 2022-03-27 DIAGNOSIS — N3289 Other specified disorders of bladder: Secondary | ICD-10-CM | POA: Diagnosis not present

## 2022-04-02 DIAGNOSIS — R918 Other nonspecific abnormal finding of lung field: Secondary | ICD-10-CM | POA: Diagnosis not present

## 2022-04-02 DIAGNOSIS — R590 Localized enlarged lymph nodes: Secondary | ICD-10-CM | POA: Diagnosis not present

## 2022-04-02 DIAGNOSIS — C210 Malignant neoplasm of anus, unspecified: Secondary | ICD-10-CM | POA: Diagnosis not present

## 2022-04-08 DIAGNOSIS — C210 Malignant neoplasm of anus, unspecified: Secondary | ICD-10-CM | POA: Diagnosis not present

## 2022-04-09 ENCOUNTER — Telehealth: Payer: Self-pay | Admitting: Family Medicine

## 2022-04-09 ENCOUNTER — Other Ambulatory Visit: Payer: Self-pay | Admitting: Family Medicine

## 2022-04-09 ENCOUNTER — Ambulatory Visit (INDEPENDENT_AMBULATORY_CARE_PROVIDER_SITE_OTHER): Payer: 59 | Admitting: Family Medicine

## 2022-04-09 ENCOUNTER — Encounter: Payer: Self-pay | Admitting: Family Medicine

## 2022-04-09 VITALS — BP 120/76 | HR 100 | Ht 72.0 in | Wt 190.0 lb

## 2022-04-09 DIAGNOSIS — I10 Essential (primary) hypertension: Secondary | ICD-10-CM

## 2022-04-09 DIAGNOSIS — Z23 Encounter for immunization: Secondary | ICD-10-CM

## 2022-04-09 DIAGNOSIS — F32A Depression, unspecified: Secondary | ICD-10-CM

## 2022-04-09 DIAGNOSIS — R69 Illness, unspecified: Secondary | ICD-10-CM | POA: Diagnosis not present

## 2022-04-09 DIAGNOSIS — F419 Anxiety disorder, unspecified: Secondary | ICD-10-CM

## 2022-04-09 MED ORDER — SERTRALINE HCL 25 MG PO TABS: 25.0000 mg | ORAL_TABLET | Freq: Every day | ORAL | 1 refills | Status: DC

## 2022-04-09 MED ORDER — LOSARTAN POTASSIUM-HCTZ 100-25 MG PO TABS: 1.0000 | ORAL_TABLET | Freq: Every day | ORAL | 1 refills | Status: DC

## 2022-04-09 MED ORDER — METOPROLOL SUCCINATE ER 25 MG PO TB24: 25.0000 mg | ORAL_TABLET | Freq: Every day | ORAL | 1 refills | Status: DC

## 2022-04-09 MED ORDER — AMLODIPINE BESYLATE 10 MG PO TABS: 10.0000 mg | ORAL_TABLET | Freq: Every day | ORAL | 1 refills | Status: DC

## 2022-04-09 NOTE — Telephone Encounter (Signed)
Copied from [place] [phone]. Topic: General - Inquiry >> Apr 09, 2022 11:24 AM Penni Bombard wrote: Reason for CRM: pt is requesting a handicap sticker application sent to his address.

## 2022-04-09 NOTE — Progress Notes (Signed)
Date:  04/09/2022   Name:  Michael Dalton.   DOB:  04-12-1960   MRN:  [id number]   Chief Complaint: Hypertension, Depression, and shingles vaccine  Hypertension This is a chronic problem. The current episode started more than 1 year ago. The problem has been gradually improving since onset. The problem is controlled. Pertinent negatives include no blurred vision, chest pain, headaches, neck pain, orthopnea, palpitations, PND or shortness of breath. There are no associated agents to hypertension. Past treatments include angiotensin blockers, diuretics, calcium channel blockers and beta blockers. The current treatment provides moderate improvement. There are no compliance problems.  There is no history of angina, kidney disease, CAD/MI, CVA, heart failure, left ventricular hypertrophy, PVD or retinopathy. There is no history of chronic renal disease, a hypertension causing med or renovascular disease.  Depression        This is a chronic problem.  The current episode started more than 1 year ago.   The problem occurs intermittently.  The problem has been gradually improving since onset.  Associated symptoms include no decreased concentration, no fatigue, no helplessness, no hopelessness, does not have insomnia, not irritable, no restlessness, no decreased interest, no appetite change, no body aches, no myalgias, no headaches, no indigestion, not sad and no suicidal ideas.  Past treatments include SSRIs - Selective serotonin reuptake inhibitors.  Compliance with treatment is good.  Previous treatment provided moderate relief.   Lab Results  Component Value Date   NA 134 09/20/2021   K 4.7 09/20/2021   CO2 23 09/20/2021   GLUCOSE 104 (H) 09/20/2021   BUN 4 (L) 09/20/2021   CREATININE 0.55 (L) 09/20/2021   CALCIUM 9.6 09/20/2021   EGFR 112 09/20/2021   Lab Results  Component Value Date   CHOL 190 01/24/2022   HDL 76 01/24/2022   LDLCALC 96 01/24/2022   TRIG 104 01/24/2022   Lab  Results  Component Value Date   TSH 1.870 09/20/2021   No results found for: "HGBA1C" Lab Results  Component Value Date   WBC 6.3 09/20/2021   HGB 14.3 09/20/2021   HCT 41.3 09/20/2021   MCV 92 09/20/2021   PLT 238 09/20/2021   Lab Results  Component Value Date   ALT 5 09/20/2021   AST 21 09/20/2021   ALKPHOS 60 09/20/2021   BILITOT 0.6 09/20/2021   No results found for: "25OHVITD2", "25OHVITD3", "VD25OH"   Review of Systems  Constitutional:  Negative for appetite change, chills, fatigue and fever.  HENT:  Negative for drooling, ear discharge, ear pain and sore throat.   Eyes:  Negative for blurred vision.  Respiratory:  Negative for cough, shortness of breath and wheezing.   Cardiovascular:  Negative for chest pain, palpitations, orthopnea, leg swelling and PND.  Gastrointestinal:  Negative for abdominal pain, blood in stool, constipation, diarrhea and nausea.  Endocrine: Negative for polydipsia.  Genitourinary:  Negative for dysuria, frequency, hematuria and urgency.  Musculoskeletal:  Negative for back pain, myalgias and neck pain.  Skin:  Negative for rash.  Allergic/Immunologic: Negative for environmental allergies.  Neurological:  Negative for dizziness and headaches.  Hematological:  Does not bruise/bleed easily.  Psychiatric/Behavioral:  Positive for depression. Negative for decreased concentration and suicidal ideas. The patient is not nervous/anxious and does not have insomnia.     There are no problems to display for this patient.   No Known Allergies  No past surgical history on file.  Social History   Tobacco Use   Smoking  status: Every Day    Packs/day: 1.00    Years: 31.00    Total pack years: 31.00    Types: Cigarettes   Smokeless tobacco: Never  Vaping Use   Vaping Use: Never used  Substance Use Topics   Alcohol use: Yes    Alcohol/week: 3.0 standard drinks of alcohol    Types: 3 Cans of beer per week    Comment: 3 beers/ daily   Drug  use: Never     Medication list has been reviewed and updated.  Current Meds  Medication Sig   amLODipine (NORVASC) 10 MG tablet Take 1 tablet (10 mg total) by mouth daily.   hydrocortisone (ANUSOL-HC) 2.5 % rectal cream Place 1 Application rectally 2 (two) times daily.   losartan-hydrochlorothiazide (HYZAAR) 100-25 MG tablet Take 1 tablet by mouth daily.   metoprolol succinate (TOPROL-XL) 25 MG 24 hr tablet Take 1 tablet (25 mg total) by mouth daily.   sertraline (ZOLOFT) 25 MG tablet Take 1 tablet (25 mg total) by mouth daily.       04/09/2022   10:05 AM 01/24/2022    8:26 AM 01/08/2022   11:09 AM 09/20/2021    1:55 PM  GAD 7 : Generalized Anxiety Score  Nervous, Anxious, on Edge 0 0 0 2  Control/stop worrying 0 0 0 2  Worry too much - different things 0 0 0 2  Trouble relaxing 0 0 0 2  Restless 0 0 0 2  Easily annoyed or irritable 0 0 0 2  Afraid - awful might happen 0 0 0 0  Total GAD 7 Score 0 0 0 12  Anxiety Difficulty Not difficult at all Not difficult at all Not difficult at all Not difficult at all       04/09/2022   10:05 AM 02/19/2022   10:49 AM 01/24/2022    8:26 AM  Depression screen PHQ 2/9  Decreased Interest 0 0 0  Down, Depressed, Hopeless 0 0 0  PHQ - 2 Score 0 0 0  Altered sleeping 0 0 0  Tired, decreased energy 0 0 0  Change in appetite 0 0 0  Feeling bad or failure about yourself  0 0 0  Trouble concentrating 0 0 0  Moving slowly or fidgety/restless 0 0 0  Suicidal thoughts 0 0 0  PHQ-9 Score 0 0 0  Difficult doing work/chores Not difficult at all Not difficult at all Not difficult at all    BP Readings from Last 3 Encounters:  04/09/22 120/76  02/19/22 (!) 140/102  01/24/22 124/84    Physical Exam Vitals and nursing note reviewed.  Constitutional:      General: He is not irritable. HENT:     Head: Normocephalic.     Right Ear: Tympanic membrane and external ear normal.     Left Ear: Tympanic membrane and external ear normal.     Nose:  Nose normal.     Mouth/Throat:     Mouth: Mucous membranes are moist.  Eyes:     General: No scleral icterus.       Right eye: No discharge.        Left eye: No discharge.     Conjunctiva/sclera: Conjunctivae normal.     Pupils: Pupils are equal, round, and reactive to light.  Neck:     Thyroid: No thyromegaly.     Vascular: No JVD.     Trachea: No tracheal deviation.  Cardiovascular:     Rate and Rhythm: Normal rate  and regular rhythm.     Heart sounds: Normal heart sounds. No murmur heard.    No friction rub. No gallop.  Pulmonary:     Effort: No respiratory distress.     Breath sounds: Normal breath sounds. No wheezing or rales.  Abdominal:     General: Bowel sounds are normal.     Palpations: Abdomen is soft. There is no mass.     Tenderness: There is no abdominal tenderness. There is no guarding or rebound.  Musculoskeletal:        General: No tenderness. Normal range of motion.     Cervical back: Normal range of motion and neck supple.  Lymphadenopathy:     Cervical: No cervical adenopathy.  Skin:    General: Skin is warm.     Findings: No rash.  Neurological:     Mental Status: He is alert.     Wt Readings from Last 3 Encounters:  04/09/22 190 lb (86.2 kg)  02/19/22 197 lb (89.4 kg)  01/24/22 190 lb (86.2 kg)    BP 120/76   Pulse 100   Ht 6' (1.829 m)   Wt 190 lb (86.2 kg)   SpO2 97%   BMI 25.77 kg/m   Assessment and Plan:  1. Primary hypertension Chronic.  Controlled.  Stable.  Blood pressure 120/76.  Asymptomatic.  Tolerating medications well.  Continue amlodipine 10 mg once a day, losartan hydrochlorothiazide 100-25 mg once a day and metoprolol XL 25 mg once a day.  Review of labs from oncology is acceptable and will recheck patient in 6 months. - amLODipine (NORVASC) 10 MG tablet; Take 1 tablet (10 mg total) by mouth daily.  Dispense: 90 tablet; Refill: 1 - losartan-hydrochlorothiazide (HYZAAR) 100-25 MG tablet; Take 1 tablet by mouth daily.   Dispense: 90 tablet; Refill: 1 - metoprolol succinate (TOPROL-XL) 25 MG 24 hr tablet; Take 1 tablet (25 mg total) by mouth daily.  Dispense: 90 tablet; Refill: 1  2. Anxiety and depression Chronic.  Controlled.  Stable.  Disposition is doing well considering the circumstances with his oncology.  Will continue sertraline 25 mg once a day.  Will recheck patient in 6 months. - sertraline (ZOLOFT) 25 MG tablet; Take 1 tablet (25 mg total) by mouth daily.  Dispense: 90 tablet; Refill: 1    Otilio Miu, MD

## 2022-04-10 ENCOUNTER — Other Ambulatory Visit: Payer: Self-pay | Admitting: Family Medicine

## 2022-04-10 DIAGNOSIS — I10 Essential (primary) hypertension: Secondary | ICD-10-CM

## 2022-04-10 NOTE — Telephone Encounter (Signed)
Resending for DX codes, already supplied #90/1 RF.   Requested Prescriptions  Pending Prescriptions Disp Refills   sertraline (ZOLOFT) 25 MG tablet [Pharmacy Med Name: SERTRALINE HCL 25 MG TABLET] 90 tablet 1    Sig: TAKE 1 TABLET (25 MG TOTAL) BY MOUTH DAILY.     Psychiatry:  Antidepressants - SSRI - sertraline Passed - 04/09/2022  9:34 AM      Passed - AST in normal range and within 360 days    AST  Date Value Ref Range Status  09/20/2021 21 0 - 40 IU/L Final         Passed - ALT in normal range and within 360 days    ALT  Date Value Ref Range Status  09/20/2021 5 0 - 44 IU/L Final         Passed - Completed PHQ-2 or PHQ-9 in the last 360 days      Passed - Valid encounter within last 6 months    Recent Outpatient Visits           Yesterday Primary hypertension   [place] [facility] [place], Deanna C, MD   1 month ago Primary hypertension   [place] [facility] [place], Deanna C, MD   2 months ago Annual physical exam   [place] [facility] [place], Deanna C, MD   3 months ago Primary hypertension   [place] [facility] [place], [place], MD   6 months ago Primary hypertension   [place] at [place], [place], MD       Future Appointments             In 6 months Juline Patch, MD [place] at [facility], [place]   In 9 months Juline Patch, MD [place] [facility] [place], [facility]             metoprolol succinate (TOPROL-XL) 25 MG 24 hr tablet [Pharmacy Med Name: METOPROLOL SUCC ER 25 MG TAB] 90 tablet 1    Sig: TAKE 1 TABLET (25 MG TOTAL) BY MOUTH DAILY.     Cardiovascular:  Beta Blockers Failed - 04/09/2022  9:34 AM      Failed - Last BP in normal range    BP Readings from Last 1  Encounters:  04/09/22 120/76         Passed - Last Heart Rate in normal range    Pulse Readings from Last 1 Encounters:  04/09/22 100         Passed - Valid encounter within last 6 months    Recent Outpatient Visits           Yesterday Primary hypertension   [place] [facility] [place], Deanna C, MD   1 month ago Primary hypertension   [place] [facility] [place], Deanna C, MD   2 months ago Annual physical exam   [place] [facility] [place], Deanna C, MD   3 months ago Primary hypertension   [place] [facility] [place], Deanna C, MD   6 months ago Primary hypertension   [place]  [facility], [facility]       Future Appointments             In 6 months Juline Patch, MD [place] at [facility], [place]   In 9 months Juline Patch, MD [place] at [facility], [facility]

## 2022-04-22 DIAGNOSIS — C210 Malignant neoplasm of anus, unspecified: Secondary | ICD-10-CM | POA: Diagnosis not present

## 2022-04-24 DIAGNOSIS — C210 Malignant neoplasm of anus, unspecified: Secondary | ICD-10-CM | POA: Diagnosis not present

## 2022-04-24 DIAGNOSIS — Z01818 Encounter for other preprocedural examination: Secondary | ICD-10-CM | POA: Diagnosis not present

## 2022-04-24 DIAGNOSIS — G893 Neoplasm related pain (acute) (chronic): Secondary | ICD-10-CM | POA: Diagnosis not present

## 2022-04-25 ENCOUNTER — Ambulatory Visit: Payer: 59

## 2022-04-25 DIAGNOSIS — Z5111 Encounter for antineoplastic chemotherapy: Secondary | ICD-10-CM | POA: Diagnosis not present

## 2022-04-25 DIAGNOSIS — C210 Malignant neoplasm of anus, unspecified: Secondary | ICD-10-CM | POA: Diagnosis not present

## 2022-04-26 DIAGNOSIS — C210 Malignant neoplasm of anus, unspecified: Secondary | ICD-10-CM | POA: Diagnosis not present

## 2022-04-29 DIAGNOSIS — C210 Malignant neoplasm of anus, unspecified: Secondary | ICD-10-CM | POA: Diagnosis not present

## 2022-04-30 DIAGNOSIS — C210 Malignant neoplasm of anus, unspecified: Secondary | ICD-10-CM | POA: Diagnosis not present

## 2022-05-01 DIAGNOSIS — C210 Malignant neoplasm of anus, unspecified: Secondary | ICD-10-CM | POA: Diagnosis not present

## 2022-05-02 DIAGNOSIS — C210 Malignant neoplasm of anus, unspecified: Secondary | ICD-10-CM | POA: Diagnosis not present

## 2022-05-03 DIAGNOSIS — C210 Malignant neoplasm of anus, unspecified: Secondary | ICD-10-CM | POA: Diagnosis not present

## 2022-05-06 DIAGNOSIS — C210 Malignant neoplasm of anus, unspecified: Secondary | ICD-10-CM | POA: Diagnosis not present

## 2022-05-07 DIAGNOSIS — C210 Malignant neoplasm of anus, unspecified: Secondary | ICD-10-CM | POA: Diagnosis not present

## 2022-05-08 DIAGNOSIS — C210 Malignant neoplasm of anus, unspecified: Secondary | ICD-10-CM | POA: Diagnosis not present

## 2022-05-09 DIAGNOSIS — C210 Malignant neoplasm of anus, unspecified: Secondary | ICD-10-CM | POA: Diagnosis not present

## 2022-05-10 DIAGNOSIS — C210 Malignant neoplasm of anus, unspecified: Secondary | ICD-10-CM | POA: Diagnosis not present

## 2022-05-13 DIAGNOSIS — C210 Malignant neoplasm of anus, unspecified: Secondary | ICD-10-CM | POA: Diagnosis not present

## 2022-05-14 DIAGNOSIS — C210 Malignant neoplasm of anus, unspecified: Secondary | ICD-10-CM | POA: Diagnosis not present

## 2022-05-15 DIAGNOSIS — C210 Malignant neoplasm of anus, unspecified: Secondary | ICD-10-CM | POA: Diagnosis not present

## 2022-05-16 DIAGNOSIS — C210 Malignant neoplasm of anus, unspecified: Secondary | ICD-10-CM | POA: Diagnosis not present

## 2022-05-17 DIAGNOSIS — C210 Malignant neoplasm of anus, unspecified: Secondary | ICD-10-CM | POA: Diagnosis not present

## 2022-05-20 DIAGNOSIS — C210 Malignant neoplasm of anus, unspecified: Secondary | ICD-10-CM | POA: Diagnosis not present

## 2022-05-21 DIAGNOSIS — C210 Malignant neoplasm of anus, unspecified: Secondary | ICD-10-CM | POA: Diagnosis not present

## 2022-05-22 DIAGNOSIS — C210 Malignant neoplasm of anus, unspecified: Secondary | ICD-10-CM | POA: Diagnosis not present

## 2022-05-23 DIAGNOSIS — C210 Malignant neoplasm of anus, unspecified: Secondary | ICD-10-CM | POA: Diagnosis not present

## 2022-05-24 DIAGNOSIS — C210 Malignant neoplasm of anus, unspecified: Secondary | ICD-10-CM | POA: Diagnosis not present

## 2022-05-27 DIAGNOSIS — C210 Malignant neoplasm of anus, unspecified: Secondary | ICD-10-CM | POA: Diagnosis not present

## 2022-05-28 DIAGNOSIS — C210 Malignant neoplasm of anus, unspecified: Secondary | ICD-10-CM | POA: Diagnosis not present

## 2022-05-29 DIAGNOSIS — C210 Malignant neoplasm of anus, unspecified: Secondary | ICD-10-CM | POA: Diagnosis not present

## 2022-05-30 DIAGNOSIS — C210 Malignant neoplasm of anus, unspecified: Secondary | ICD-10-CM | POA: Diagnosis not present

## 2022-05-31 DIAGNOSIS — C210 Malignant neoplasm of anus, unspecified: Secondary | ICD-10-CM | POA: Diagnosis not present

## 2022-06-03 DIAGNOSIS — C210 Malignant neoplasm of anus, unspecified: Secondary | ICD-10-CM | POA: Diagnosis not present

## 2022-06-04 DIAGNOSIS — C210 Malignant neoplasm of anus, unspecified: Secondary | ICD-10-CM | POA: Diagnosis not present

## 2022-06-05 DIAGNOSIS — C210 Malignant neoplasm of anus, unspecified: Secondary | ICD-10-CM | POA: Diagnosis not present

## 2022-06-06 DIAGNOSIS — C210 Malignant neoplasm of anus, unspecified: Secondary | ICD-10-CM | POA: Diagnosis not present

## 2022-06-26 DIAGNOSIS — C210 Malignant neoplasm of anus, unspecified: Secondary | ICD-10-CM | POA: Diagnosis not present

## 2022-08-29 DIAGNOSIS — C210 Malignant neoplasm of anus, unspecified: Secondary | ICD-10-CM | POA: Diagnosis not present

## 2022-09-04 DIAGNOSIS — C210 Malignant neoplasm of anus, unspecified: Secondary | ICD-10-CM | POA: Diagnosis not present

## 2022-09-05 DIAGNOSIS — Z923 Personal history of irradiation: Secondary | ICD-10-CM | POA: Diagnosis not present

## 2022-09-05 DIAGNOSIS — C210 Malignant neoplasm of anus, unspecified: Secondary | ICD-10-CM | POA: Diagnosis not present

## 2022-09-05 DIAGNOSIS — Z79899 Other long term (current) drug therapy: Secondary | ICD-10-CM | POA: Diagnosis not present

## 2022-09-05 DIAGNOSIS — F17210 Nicotine dependence, cigarettes, uncomplicated: Secondary | ICD-10-CM | POA: Diagnosis not present

## 2022-09-25 ENCOUNTER — Other Ambulatory Visit: Payer: Self-pay | Admitting: Family Medicine

## 2022-09-25 DIAGNOSIS — I10 Essential (primary) hypertension: Secondary | ICD-10-CM

## 2022-09-25 DIAGNOSIS — F32A Anxiety disorder, unspecified: Secondary | ICD-10-CM

## 2022-09-26 NOTE — Telephone Encounter (Signed)
Requested Prescriptions  Pending Prescriptions Disp Refills   sertraline (ZOLOFT) 25 MG tablet [Pharmacy Med Name: SERTRALINE HCL 25 MG TABLET] 90 tablet 0    Sig: TAKE 1 TABLET (25 MG TOTAL) BY MOUTH DAILY.     Psychiatry:  Antidepressants - SSRI - sertraline Failed - 09/25/2022  2:14 AM      Failed - AST in normal range and within 360 days    AST  Date Value Ref Range Status  09/20/2021 21 0 - 40 IU/L Final         Failed - ALT in normal range and within 360 days    ALT  Date Value Ref Range Status  09/20/2021 5 0 - 44 IU/L Final         Passed - Completed PHQ-2 or PHQ-9 in the last 360 days      Passed - Valid encounter within last 6 months    Recent Outpatient Visits           5 months ago Primary hypertension   [place] [facility], [facility]   7 months ago Primary hypertension   [place] [facility], MD   8 months ago Annual physical exam   [facility], MD   8 months ago Primary hypertension   [place] [facility], MD   1 year ago Primary hypertension   [place] [facility], MD       Future Appointments             In 1 week Duanne Limerick, MD [facility] at [facility], [facility]   In 4 months Duanne Limerick, MD [facility] at [facility], [facility]             metoprolol succinate (TOPROL-XL) 25 MG 24 hr tablet [Pharmacy Med Name: METOPROLOL SUCC ER 25 MG TAB] 90 tablet 0    Sig: TAKE 1 TABLET (25 MG TOTAL) BY MOUTH DAILY.     Cardiovascular:  Beta Blockers Passed - 09/25/2022  2:14 AM      Passed - Last BP in normal range    BP Readings from Last 1 Encounters:  04/09/22 120/76         Passed - Last Heart  Rate in normal range    Pulse Readings from Last 1 Encounters:  04/09/22 100         Passed - Valid encounter within last 6 months    Recent Outpatient Visits           5 months ago Primary hypertension   [place] [facility], MD   7 months ago Primary hypertension   [place] [facility], MD   8 months ago Annual physical exam   [facility], MD   8 months ago Primary hypertension   [place] [facility], MD   1 year ago Primary hypertension   [facility] at [facility], [place]  C, MD       Future Appointments             In 1 week Duanne Limerick, MD [facility] at [facility], [facility]   In 4 months Duanne Limerick, MD [facility] at [facility]

## 2022-10-05 ENCOUNTER — Other Ambulatory Visit: Payer: Self-pay | Admitting: Family Medicine

## 2022-10-05 DIAGNOSIS — I10 Essential (primary) hypertension: Secondary | ICD-10-CM

## 2022-10-07 NOTE — Telephone Encounter (Signed)
Requested Prescriptions  Pending Prescriptions Disp Refills   amLODipine (NORVASC) 10 MG tablet [Pharmacy Med Name: AMLODIPINE BESYLATE 10 MG TAB] 30 tablet 0    Sig: TAKE 1 TABLET BY MOUTH EVERY DAY     Cardiovascular: Calcium Channel Blockers 2 Passed - 10/05/2022 10:43 AM      Passed - Last BP in normal range    BP Readings from Last 1 Encounters:  04/09/22 120/76         Passed - Last Heart Rate in normal range    Pulse Readings from Last 1 Encounters:  04/09/22 100         Passed - Valid encounter within last 6 months    Recent Outpatient Visits           6 months ago Primary hypertension   [place] [facility], MD   7 months ago Primary hypertension   [place] [facility], MD   8 months ago Annual physical exam   [place] [facility], MD   9 months ago Primary hypertension   [place] [facility], MD   1 year ago Primary hypertension   [place] [facility], MD       Future Appointments             Tomorrow Duanne Limerick, MD [facility] at [facility]   In 3 months Duanne Limerick, MD [facility] at [facility], [facility]             losartan-hydrochlorothiazide (HYZAAR) 100-25 MG tablet [Pharmacy Med Name: LOSARTAN-HCTZ 100-25 MG TAB] 30 tablet     Sig: TAKE 1 TABLET BY MOUTH EVERY DAY     Cardiovascular: ARB + Diuretic Combos Failed - 10/05/2022 10:43 AM      Failed - K in normal range and within 180 days    Potassium  Date Value Ref Range Status  09/20/2021 4.7 3.5 - 5.2 mmol/L Final         Failed - Na in normal range and within 180 days    Sodium  Date Value Ref Range Status  09/20/2021 134 134  - 144 mmol/L Final         Failed - Cr in normal range and within 180 days    Creatinine, Ser  Date Value Ref Range Status  09/20/2021 0.55 (L) 0.76 - 1.27 mg/dL Final         Failed - eGFR is 10 or above and within 180 days    eGFR  Date Value Ref Range Status  09/20/2021 112 >59 mL/min/1.73 Final         Passed - Patient is not pregnant      Passed - Last BP in normal range    BP Readings from Last 1 Encounters:  04/09/22 120/76         Passed - Valid encounter within last 6 months    Recent Outpatient Visits           6 months ago Primary hypertension    [facility], [facility]   7 months ago Primary hypertension   [facility], MD   8 months ago Annual physical exam   [facility], MD   9 months ago Primary hypertension   [place] [facility], MD   1 year ago Primary hypertension   [place] [facility], MD       Future Appointments             Tomorrow Duanne Limerick, MD [facility] at [facility]   In 3 months Duanne Limerick, MD [facility] at [facility], [facility]

## 2022-10-07 NOTE — Telephone Encounter (Signed)
Requested medication (s) are due for refill today:yes  Requested medication (s) are on the active medication list:yes Last refill:  04/09/22  Future visit scheduled:yes  Notes to clinic:  overdue lab work    Requested Prescriptions  Pending Prescriptions Disp Refills   losartan-hydrochlorothiazide (HYZAAR) 100-25 MG tablet [Pharmacy Med Name: LOSARTAN-HCTZ 100-25 MG TAB] 30 tablet     Sig: TAKE 1 TABLET BY MOUTH EVERY DAY     Cardiovascular: ARB + Diuretic Combos Failed - 10/05/2022 10:43 AM      Failed - K in normal range and within 180 days    Potassium  Date Value Ref Range Status  09/20/2021 4.7 3.5 - 5.2 mmol/L Final         Failed - Na in normal range and within 180 days    Sodium  Date Value Ref Range Status  09/20/2021 134 134 - 144 mmol/L Final         Failed - Cr in normal range and within 180 days    Creatinine, Ser  Date Value Ref Range Status  09/20/2021 0.55 (L) 0.76 - 1.27 mg/dL Final         Failed - eGFR is 10 or above and within 180 days    eGFR  Date Value Ref Range Status  09/20/2021 112 >59 mL/min/1.73 Final         Passed - Patient is not pregnant      Passed - Last BP in normal range    BP Readings from Last 1 Encounters:  04/09/22 120/76         Passed - Valid encounter within last 6 months    Recent Outpatient Visits           6 months ago Primary hypertension   [place] [facility]   7 months ago Primary hypertension   [place] [facility]   8 months ago Annual physical exam   [facility]   9 months ago Primary hypertension   [place] [facility]   1 year ago Primary hypertension   [place] [facility]       Future  Appointments             Tomorrow Duanne Limerick, MD [facility] at [facility]   In 3 months Duanne Limerick, MD [facility] at [facility], [facility]            Signed Prescriptions Disp Refills   amLODipine (NORVASC) 10 MG tablet 30 tablet 0    Sig: TAKE 1 TABLET BY MOUTH EVERY DAY     Cardiovascular: Calcium Channel Blockers 2 Passed - 10/05/2022 10:43 AM      Passed - Last BP in normal range    BP Readings from Last 1 Encounters:  04/09/22 120/76         Passed - Last Heart Rate in normal range    Pulse Readings from Last 1 Encounters:  04/09/22 100         Passed - Valid encounter within last 6 months    Recent Outpatient Visits  6 months ago Primary hypertension   [place] [facility]   7 months ago Primary hypertension   [place] [facility]   8 months ago Annual physical exam   [facility]   9 months ago Primary hypertension   [place] [facility]   1 year ago Primary hypertension   [place] [facility]       Future Appointments             Tomorrow Duanne Limerick, MD [facility] at [facility]   In 3 months Duanne Limerick, MD [facility] at [facility], [facility]

## 2022-10-07 NOTE — Telephone Encounter (Signed)
Pt has an appointment tomorrow.  KP

## 2022-10-08 ENCOUNTER — Ambulatory Visit (INDEPENDENT_AMBULATORY_CARE_PROVIDER_SITE_OTHER): Payer: 59 | Admitting: Family Medicine

## 2022-10-08 ENCOUNTER — Encounter: Payer: Self-pay | Admitting: Family Medicine

## 2022-10-08 VITALS — BP 100/68 | HR 101 | Ht 72.0 in | Wt 190.0 lb

## 2022-10-08 DIAGNOSIS — F32A Depression, unspecified: Secondary | ICD-10-CM | POA: Diagnosis not present

## 2022-10-08 DIAGNOSIS — I10 Essential (primary) hypertension: Secondary | ICD-10-CM | POA: Diagnosis not present

## 2022-10-08 DIAGNOSIS — F419 Anxiety disorder, unspecified: Secondary | ICD-10-CM | POA: Diagnosis not present

## 2022-10-08 MED ORDER — SERTRALINE HCL 25 MG PO TABS
25.0000 mg | ORAL_TABLET | Freq: Every day | ORAL | 1 refills | Status: DC
Start: 2022-10-08 — End: 2023-03-25

## 2022-10-08 MED ORDER — AMLODIPINE BESYLATE 10 MG PO TABS
10.0000 mg | ORAL_TABLET | Freq: Every day | ORAL | 1 refills | Status: DC
Start: 2022-10-08 — End: 2023-03-25

## 2022-10-08 MED ORDER — LOSARTAN POTASSIUM-HCTZ 100-25 MG PO TABS
1.0000 | ORAL_TABLET | Freq: Every day | ORAL | 1 refills | Status: DC
Start: 2022-10-08 — End: 2023-03-25

## 2022-10-08 MED ORDER — METOPROLOL SUCCINATE ER 25 MG PO TB24
25.0000 mg | ORAL_TABLET | Freq: Every day | ORAL | 1 refills | Status: DC
Start: 2022-10-08 — End: 2023-03-25

## 2022-10-08 NOTE — Progress Notes (Signed)
Date:  10/08/2022   Name:  Michael Dalton.   DOB:  May 07, 1960   MRN:  [id number]   Chief Complaint: Depression and Hypertension  Depression        This is a chronic problem.  The current episode started more than 1 year ago.   The onset quality is gradual.   The problem has been gradually improving since onset.  Associated symptoms include no decreased concentration, no fatigue, no helplessness, no hopelessness, does not have insomnia, not irritable, no restlessness, no decreased interest, no appetite change, no body aches, no myalgias, no headaches, no indigestion, not sad and no suicidal ideas.  Past treatments include SSRIs - Selective serotonin reuptake inhibitors.  Compliance with treatment is good.  Previous treatment provided moderate relief. Hypertension This is a chronic problem. The current episode started more than 1 year ago. The problem has been gradually improving since onset. The problem is controlled. Pertinent negatives include no blurred vision, chest pain, headaches, malaise/fatigue, orthopnea, palpitations, peripheral edema, PND or shortness of breath. Risk factors for coronary artery disease include dyslipidemia. Past treatments include calcium channel blockers, beta blockers, angiotensin blockers and diuretics. The current treatment provides moderate improvement. There are no compliance problems.  There is no history of angina, CAD/MI or CVA. There is no history of chronic renal disease, a hypertension causing med or renovascular disease.    Lab Results  Component Value Date   NA 134 09/20/2021   K 4.7 09/20/2021   CO2 23 09/20/2021   GLUCOSE 104 (H) 09/20/2021   BUN 4 (L) 09/20/2021   CREATININE 0.55 (L) 09/20/2021   CALCIUM 9.6 09/20/2021   EGFR 112 09/20/2021   Lab Results  Component Value Date   CHOL 190 01/24/2022   HDL 76 01/24/2022   LDLCALC 96 01/24/2022   TRIG 104 01/24/2022   Lab Results  Component Value Date   TSH 1.870 09/20/2021   No  results found for: "HGBA1C" Lab Results  Component Value Date   WBC 6.3 09/20/2021   HGB 14.3 09/20/2021   HCT 41.3 09/20/2021   MCV 92 09/20/2021   PLT 238 09/20/2021   Lab Results  Component Value Date   ALT 5 09/20/2021   AST 21 09/20/2021   ALKPHOS 60 09/20/2021   BILITOT 0.6 09/20/2021   No results found for: "25OHVITD2", "25OHVITD3", "VD25OH"   Review of Systems  Constitutional:  Negative for appetite change, fatigue and malaise/fatigue.  HENT:  Negative for congestion.   Eyes:  Negative for blurred vision and visual disturbance.  Respiratory:  Negative for cough, chest tightness, shortness of breath and wheezing.   Cardiovascular:  Negative for chest pain, palpitations, orthopnea and PND.  Gastrointestinal:  Negative for abdominal distention.  Musculoskeletal:  Negative for arthralgias and myalgias.  Neurological:  Negative for headaches.  Psychiatric/Behavioral:  Positive for depression. Negative for decreased concentration and suicidal ideas. The patient does not have insomnia.     There are no problems to display for this patient.   No Known Allergies  No past surgical history on file.  Social History   Tobacco Use   Smoking status: Every Day    Current packs/day: 1.00    Average packs/day: 1 pack/day for 31.0 years (31.0 ttl pk-yrs)    Types: Cigarettes   Smokeless tobacco: Never  Vaping Use   Vaping status: Never Used  Substance Use Topics   Alcohol use: Yes    Alcohol/week: 3.0 standard drinks of alcohol  Types: 3 Cans of beer per week    Comment: 3 beers/ daily   Drug use: Never     Medication list has been reviewed and updated.  Current Meds  Medication Sig   amLODipine (NORVASC) 10 MG tablet TAKE 1 TABLET BY MOUTH EVERY DAY   hydrocortisone (ANUSOL-HC) 2.5 % rectal cream Place 1 Application rectally 2 (two) times daily.   losartan-hydrochlorothiazide (HYZAAR) 100-25 MG tablet Take 1 tablet by mouth daily.   metoprolol succinate  (TOPROL-XL) 25 MG 24 hr tablet TAKE 1 TABLET (25 MG TOTAL) BY MOUTH DAILY.   sertraline (ZOLOFT) 25 MG tablet TAKE 1 TABLET (25 MG TOTAL) BY MOUTH DAILY.   [DISCONTINUED] metoprolol succinate (TOPROL-XL) 25 MG 24 hr tablet TAKE 1 TABLET (25 MG TOTAL) BY MOUTH DAILY.   [DISCONTINUED] sertraline (ZOLOFT) 25 MG tablet TAKE 1 TABLET (25 MG TOTAL) BY MOUTH DAILY.       10/08/2022   10:45 AM 04/09/2022   10:05 AM 01/24/2022    8:26 AM 01/08/2022   11:09 AM  GAD 7 : Generalized Anxiety Score  Nervous, Anxious, on Edge 0 0 0 0  Control/stop worrying 0 0 0 0  Worry too much - different things 0 0 0 0  Trouble relaxing 0 0 0 0  Restless 0 0 0 0  Easily annoyed or irritable 0 0 0 0  Afraid - awful might happen 0 0 0 0  Total GAD 7 Score 0 0 0 0  Anxiety Difficulty Not difficult at all Not difficult at all Not difficult at all Not difficult at all       10/08/2022   10:45 AM 04/09/2022   10:05 AM 02/19/2022   10:49 AM  Depression screen PHQ 2/9  Decreased Interest 0 0 0  Down, Depressed, Hopeless 0 0 0  PHQ - 2 Score 0 0 0  Altered sleeping 0 0 0  Tired, decreased energy 0 0 0  Change in appetite 0 0 0  Feeling bad or failure about yourself  0 0 0  Trouble concentrating 0 0 0  Moving slowly or fidgety/restless 0 0 0  Suicidal thoughts 0 0 0  PHQ-9 Score 0 0 0  Difficult doing work/chores Not difficult at all Not difficult at all Not difficult at all    BP Readings from Last 3 Encounters:  10/08/22 100/68  04/09/22 120/76  02/19/22 (!) 140/102    Physical Exam Vitals and nursing note reviewed.  Constitutional:      General: He is not irritable. HENT:     Head: Normocephalic.     Right Ear: Tympanic membrane and external ear normal.     Left Ear: Tympanic membrane and external ear normal.     Nose: Nose normal. No congestion or rhinorrhea.     Mouth/Throat:     Pharynx: No oropharyngeal exudate or posterior oropharyngeal erythema.  Eyes:     General: No scleral icterus.        Right eye: No discharge.        Left eye: No discharge.     Conjunctiva/sclera: Conjunctivae normal.     Pupils: Pupils are equal, round, and reactive to light.  Neck:     Thyroid: No thyromegaly.     Vascular: No JVD.     Trachea: No tracheal deviation.  Cardiovascular:     Rate and Rhythm: Normal rate and regular rhythm.     Heart sounds: Normal heart sounds. No murmur heard.    No friction  rub. No gallop.  Pulmonary:     Effort: No respiratory distress.     Breath sounds: Normal breath sounds. No wheezing, rhonchi or rales.  Abdominal:     General: Bowel sounds are normal.     Palpations: Abdomen is soft. There is no mass.     Tenderness: There is no abdominal tenderness. There is no guarding or rebound.  Musculoskeletal:        General: No tenderness. Normal range of motion.     Cervical back: Normal range of motion and neck supple.  Lymphadenopathy:     Cervical: No cervical adenopathy.  Skin:    General: Skin is warm.     Findings: No rash.  Neurological:     Mental Status: He is alert and oriented to person, place, and time.     Cranial Nerves: No cranial nerve deficit.     Deep Tendon Reflexes: Reflexes are normal and symmetric.     Wt Readings from Last 3 Encounters:  10/08/22 190 lb (86.2 kg)  04/09/22 190 lb (86.2 kg)  02/19/22 197 lb (89.4 kg)    BP 100/68   Pulse (!) 101   Ht 6' (1.829 m)   Wt 190 lb (86.2 kg)   SpO2 98%   BMI 25.77 kg/m   Assessment and Plan:  1. Primary hypertension Chronic.  Controlled.  Stable.  Blood pressure 100/68.  Asymptomatic.  Tolerating medications well.  Continue amlodipine 10 mg once a day, losartan hydrochlorothiazide 100-25 mg once a day and metoprolol XL 25 mg once a day.  Review of previous labs acceptable will recheck patient in 6 months. - amLODipine (NORVASC) 10 MG tablet; Take 1 tablet (10 mg total) by mouth daily.  Dispense: 90 tablet; Refill: 1 - losartan-hydrochlorothiazide (HYZAAR) 100-25 MG tablet; Take 1  tablet by mouth daily.  Dispense: 90 tablet; Refill: 1 - metoprolol succinate (TOPROL-XL) 25 MG 24 hr tablet; Take 1 tablet (25 mg total) by mouth daily.  Dispense: 90 tablet; Refill: 1  2. Anxiety and depression Chronic.  Controlled.  Stable.  PHQ 0 GAD score is 0.  Despite circumstances of rectal cancer patient is doing well on chemotherapy and has an excellent attitude.  We will continue sertraline 25 mg once a day and will recheck patient on an as-needed basis or in 6 months whichever comes first. - sertraline (ZOLOFT) 25 MG tablet; Take 1 tablet (25 mg total) by mouth daily.  Dispense: 90 tablet; Refill: 1    Elizabeth Sauer, MD

## 2022-10-10 ENCOUNTER — Telehealth: Payer: Self-pay | Admitting: Family Medicine

## 2022-10-10 DIAGNOSIS — Z933 Colostomy status: Secondary | ICD-10-CM | POA: Diagnosis not present

## 2022-10-10 DIAGNOSIS — C210 Malignant neoplasm of anus, unspecified: Secondary | ICD-10-CM | POA: Diagnosis not present

## 2022-10-10 NOTE — Telephone Encounter (Signed)
Copied from CRM [id number]. Topic: General - Inquiry >> Oct 10, 2022 10:08 AM Lennox Pippins wrote: Patient called and stated he forgot to mention this at his last visit, he wanted to know if he could have his handicap placard updated, he needs this extended, per patient it ends the end of August and he needs this for upcoming doctor's visits.  Please advise and contact patient back @ [phone]

## 2022-12-05 DIAGNOSIS — K6289 Other specified diseases of anus and rectum: Secondary | ICD-10-CM | POA: Diagnosis not present

## 2022-12-05 DIAGNOSIS — C20 Malignant neoplasm of rectum: Secondary | ICD-10-CM | POA: Diagnosis not present

## 2022-12-05 DIAGNOSIS — C210 Malignant neoplasm of anus, unspecified: Secondary | ICD-10-CM | POA: Diagnosis not present

## 2022-12-05 DIAGNOSIS — C220 Liver cell carcinoma: Secondary | ICD-10-CM | POA: Diagnosis not present

## 2022-12-05 DIAGNOSIS — N3289 Other specified disorders of bladder: Secondary | ICD-10-CM | POA: Diagnosis not present

## 2023-01-13 ENCOUNTER — Encounter: Payer: 59 | Admitting: Family Medicine

## 2023-01-28 ENCOUNTER — Encounter: Payer: Self-pay | Admitting: Family Medicine

## 2023-01-28 ENCOUNTER — Ambulatory Visit (INDEPENDENT_AMBULATORY_CARE_PROVIDER_SITE_OTHER): Payer: 59 | Admitting: Family Medicine

## 2023-01-28 VITALS — BP 120/76 | HR 90 | Ht 72.0 in | Wt 198.2 lb

## 2023-01-28 DIAGNOSIS — Z0000 Encounter for general adult medical examination without abnormal findings: Secondary | ICD-10-CM

## 2023-01-28 DIAGNOSIS — Z23 Encounter for immunization: Secondary | ICD-10-CM

## 2023-01-28 NOTE — Progress Notes (Unsigned)
Date:  01/28/2023   Name:  Annye Asa.   DOB:  1960/05/03   MRN:  [id number]   Chief Complaint: Annual Exam (Patient is here for his annual physical he does not have any big concerns. Just would like to discuss some patches on his right cheek.)  Patient is a 62 year old male who presents for a comprehensive physical exam. The patient reports the following problems: none. Health maintenance has been reviewed up to date      Lab Results  Component Value Date   NA 134 09/20/2021   K 4.7 09/20/2021   CO2 23 09/20/2021   GLUCOSE 104 (H) 09/20/2021   BUN 4 (L) 09/20/2021   CREATININE 0.55 (L) 09/20/2021   CALCIUM 9.6 09/20/2021   EGFR 112 09/20/2021   Lab Results  Component Value Date   CHOL 190 01/24/2022   HDL 76 01/24/2022   LDLCALC 96 01/24/2022   TRIG 104 01/24/2022   Lab Results  Component Value Date   TSH 1.870 09/20/2021   No results found for: "HGBA1C" Lab Results  Component Value Date   WBC 6.3 09/20/2021   HGB 14.3 09/20/2021   HCT 41.3 09/20/2021   MCV 92 09/20/2021   PLT 238 09/20/2021   Lab Results  Component Value Date   ALT 5 09/20/2021   AST 21 09/20/2021   ALKPHOS 60 09/20/2021   BILITOT 0.6 09/20/2021   No results found for: "25OHVITD2", "25OHVITD3", "VD25OH"   Review of Systems  Constitutional:  Negative for chills and fever.  HENT:  Negative for drooling, ear discharge, ear pain and sore throat.   Respiratory:  Negative for cough, shortness of breath and wheezing.   Cardiovascular:  Negative for chest pain, palpitations and leg swelling.  Gastrointestinal:  Negative for abdominal pain, blood in stool, constipation, diarrhea and nausea.  Endocrine: Negative for polydipsia.  Genitourinary:  Negative for dysuria, frequency, hematuria and urgency.  Musculoskeletal:  Negative for back pain, myalgias and neck pain.  Skin:  Negative for rash.  Allergic/Immunologic: Negative for environmental allergies.  Neurological:  Negative for  dizziness and headaches.  Hematological:  Does not bruise/bleed easily.  Psychiatric/Behavioral:  Negative for suicidal ideas. The patient is not nervous/anxious.     There are no problems to display for this patient.   Allergies  Allergen Reactions   Oxycodone Itching    History reviewed. No pertinent surgical history.  Social History   Tobacco Use   Smoking status: Every Day    Current packs/day: 1.00    Average packs/day: 1 pack/day for 31.0 years (31.0 ttl pk-yrs)    Types: Cigarettes   Smokeless tobacco: Never  Vaping Use   Vaping status: Never Used  Substance Use Topics   Alcohol use: Yes    Alcohol/week: 3.0 standard drinks of alcohol    Types: 3 Cans of beer per week    Comment: 3 beers/ daily   Drug use: Never     Medication list has been reviewed and updated.  Current Meds  Medication Sig   amLODipine (NORVASC) 10 MG tablet Take 1 tablet (10 mg total) by mouth daily.   losartan-hydrochlorothiazide (HYZAAR) 100-25 MG tablet Take 1 tablet by mouth daily.   metoprolol succinate (TOPROL-XL) 25 MG 24 hr tablet Take 1 tablet (25 mg total) by mouth daily.   sertraline (ZOLOFT) 25 MG tablet Take 1 tablet (25 mg total) by mouth daily.       01/28/2023    8:46 AM  10/08/2022   10:45 AM 04/09/2022   10:05 AM 01/24/2022    8:26 AM  GAD 7 : Generalized Anxiety Score  Nervous, Anxious, on Edge 1 0 0 0  Control/stop worrying 0 0 0 0  Worry too much - different things 0 0 0 0  Trouble relaxing 0 0 0 0  Restless 0 0 0 0  Easily annoyed or irritable 1 0 0 0  Afraid - awful might happen 0 0 0 0  Total GAD 7 Score 2 0 0 0  Anxiety Difficulty Not difficult at all Not difficult at all Not difficult at all Not difficult at all       01/28/2023    8:46 AM 10/08/2022   10:45 AM 04/09/2022   10:05 AM  Depression screen PHQ 2/9  Decreased Interest 0 0 0  Down, Depressed, Hopeless 0 0 0  PHQ - 2 Score 0 0 0  Altered sleeping 0 0 0  Tired, decreased energy 1 0 0   Change in appetite 0 0 0  Feeling bad or failure about yourself  0 0 0  Trouble concentrating 0 0 0  Moving slowly or fidgety/restless 1 0 0  Suicidal thoughts 0 0 0  PHQ-9 Score 2 0 0  Difficult doing work/chores Not difficult at all Not difficult at all Not difficult at all    BP Readings from Last 3 Encounters:  01/28/23 120/76  10/08/22 100/68  04/09/22 120/76    Physical Exam Vitals and nursing note reviewed.  HENT:     Head: Normocephalic.     Right Ear: Hearing, tympanic membrane, ear canal and external ear normal.     Left Ear: Hearing, tympanic membrane, ear canal and external ear normal.     Nose: Nose normal.     Mouth/Throat:     Lips: Pink.     Mouth: Mucous membranes are moist.     Tongue: No lesions.     Palate: No mass.     Pharynx: Oropharynx is clear. Uvula midline.  Eyes:     General: Lids are normal. Vision grossly intact. Gaze aligned appropriately. No scleral icterus.       Right eye: No discharge.        Left eye: No discharge.     Conjunctiva/sclera: Conjunctivae normal.     Pupils: Pupils are equal, round, and reactive to light.  Neck:     Thyroid: No thyromegaly.     Vascular: Normal carotid pulses. No carotid bruit, hepatojugular reflux or JVD.     Trachea: Trachea and phonation normal. No tracheal deviation.  Cardiovascular:     Rate and Rhythm: Normal rate and regular rhythm.     Chest Wall: PMI is not displaced.     Pulses: Normal pulses.          Carotid pulses are 2+ on the right side and 2+ on the left side.      Radial pulses are 2+ on the right side and 2+ on the left side.       Femoral pulses are 2+ on the right side and 2+ on the left side.      Popliteal pulses are 2+ on the right side and 2+ on the left side.       Dorsalis pedis pulses are 2+ on the right side and 2+ on the left side.       Posterior tibial pulses are 2+ on the right side and 2+ on the left side.  Heart sounds: Normal heart sounds, S1 normal and S2 normal.  No murmur heard.    No systolic murmur is present.     No diastolic murmur is present.     No friction rub. No gallop.  Pulmonary:     Effort: No respiratory distress.     Breath sounds: Normal breath sounds. No decreased breath sounds, wheezing, rhonchi or rales.  Chest:  Breasts:    Breasts are symmetrical.     Right: Normal. No swelling.     Left: Normal. No swelling.  Abdominal:     General: Bowel sounds are normal.     Palpations: Abdomen is soft. There is no hepatomegaly, splenomegaly or mass.     Tenderness: There is no abdominal tenderness. There is no guarding or rebound.  Genitourinary:    Penis: Normal.      Testes: Normal.     Prostate: Normal. Not enlarged, not tender and no nodules present.     Rectum: Normal. Guaiac result negative. No mass.  Musculoskeletal:        General: No tenderness. Normal range of motion.     Cervical back: Normal range of motion and neck supple.  Lymphadenopathy:     Cervical: No cervical adenopathy.     Right cervical: No superficial, deep or posterior cervical adenopathy.    Left cervical: No superficial, deep or posterior cervical adenopathy.     Upper Body:     Right upper body: No supraclavicular or axillary adenopathy.     Left upper body: No supraclavicular or axillary adenopathy.  Skin:    General: Skin is warm.     Capillary Refill: Capillary refill takes less than 2 seconds.     Findings: No rash.  Neurological:     Mental Status: He is alert and oriented to person, place, and time.     Cranial Nerves: No cranial nerve deficit.     Sensory: Sensation is intact.     Motor: Motor function is intact.     Deep Tendon Reflexes: Reflexes are normal and symmetric.     Reflex Scores:      Tricep reflexes are 2+ on the right side and 2+ on the left side.      Bicep reflexes are 2+ on the right side and 2+ on the left side.      Brachioradialis reflexes are 2+ on the right side and 2+ on the left side.      Patellar reflexes are 2+  on the right side and 2+ on the left side.      Achilles reflexes are 2+ on the right side and 2+ on the left side. Psychiatric:        Behavior: Behavior is cooperative.     Wt Readings from Last 3 Encounters:  01/28/23 198 lb 3.2 oz (89.9 kg)  10/08/22 190 lb (86.2 kg)  04/09/22 190 lb (86.2 kg)    BP 120/76   Pulse 90   Ht 6' (1.829 m)   Wt 198 lb 3.2 oz (89.9 kg)   SpO2 97%   BMI 26.88 kg/m   Assessment and Plan: Annye Asa. is a 62 y.o. male who presents today for his Complete Annual Exam. He feels well. He reports exercising . He reports he is sleeping well.Immunizations are reviewed and recommendations provided.   Age appropriate screening tests are discussed. Counseling given for risk factor reduction interventions.    1. Annual physical exam No subjective/objective concerns noted on HPI, review  of past medical history/review of medications/review of most recent labs, review of systems and physical exam.  Will check lipid panel and PSA as that he has had other labs done with his specialist which are acceptable at this time - Lipid Panel With LDL/HDL Ratio - PSA  2. Need for vaccination for H flu type B Discussed and administered - Flu vaccine trivalent PF, 6mos and older(Flulaval,Afluria,Fluarix,Fluzone)  3. Need for vaccination Discussed and administered - Pfizer Comirnaty Covid -19 Vaccine 54yrs and older    Elizabeth Sauer, MD

## 2023-01-29 LAB — LIPID PANEL WITH LDL/HDL RATIO
Cholesterol, Total: 169 mg/dL (ref 100–199)
HDL: 69 mg/dL (ref >39)
LDL Chol Calc (NIH): 87 mg/dL (ref 0–99)
LDL/HDL Ratio: 1.3 {ratio} (ref 0.0–3.6)
Triglycerides: 68 mg/dL (ref 0–149)
VLDL Cholesterol Cal: 13 mg/dL (ref 5–40)

## 2023-01-29 LAB — PSA: Prostate Specific Ag, Serum: 0.4 ng/mL (ref 0.0–4.0)

## 2023-01-29 NOTE — Patient Instructions (Signed)
GUIDELINES FOR  °LOW-CHOLESTEROL, LOW-TRIGLYCERIDE DIETS  °  °FOODS TO USE  ° °MEATS, FISH Choose lean meats (chicken, turkey, veal, and non-fatty cuts of beef with excess fat trimmed; one serving = 3 oz of cooked meat). Also, fresh or frozen fish, canned fish packed in water, and shellfish (lobster, crabs, shrimp, and oysters). Limit use to no more than one serving of one of these per week. Shellfish are high in cholesterol but low in saturated fat and should be used sparingly. Meats and fish should be broiled (pan or oven) or baked on a rack.  °EGGS Egg substitutes and egg whites (use freely). Egg yolks (limit two per week).  °FRUITS Eat three servings of fresh fruit per day (1 serving = ½ cup). Be sure to have at least one citrus fruit daily. Frozen and canned fruit with no sugar or syrup added may be used.  °VEGETABLES Most vegetables are not limited (see next page). One dark-green (string beans, escarole) or one deep yellow (squash) vegetable is recommended daily. Cauliflower, broccoli, and celery, as well as potato skins, are recommended for their fiber content. (Fiber is associated with cholesterol reduction) It is preferable to steam vegetables, but they may be boiled, strained, or braised with polyunsaturated vegetable oil (see below).  °BEANS Dried peas or beans (1 serving = ½ cup) may be used as a bread substitute.  °NUTS Almonds, walnuts, and peanuts may be used sparingly  °(1 serving = 1 Tablespoonful). Use pumpkin, sesame, or sunflower seeds.  °BREADS, GRAINS One roll or one slice of whole grain or enriched bread may be used, or three soda crackers or four pieces of melba toast as a substitute. Spaghetti, rice or noodles (½ cup) or ½ large ear of corn may be used as a bread substitute. In preparing these foods do not use butter or shortening, use soft margarine. Also use egg and sugar substitutes.  Choose high fiber grains, such as oats and whole wheat.  °CEREALS Use ½ cup of hot cereal or ¾ cup of  cold cereal per day. Add a sugar substitute if desired, with 99% fat free or skim milk.  °MILK PRODUCTS Always use 99% fat free or skim milk, dairy products such as low fat cheeses (farmer's uncreamed diet cottage), low-fat yogurt, and powdered skim milk.  °FATS, OILS Use soft (not stick) margarine; vegetable oils that are high in polyunsaturated fats (such as safflower, sunflower, soybean, corn, and cottonseed). Always refrigerate meat drippings to harden the fat and remove it before preparing gravies  °DESSERTS, SNACKS Limit to two servings per day; substitute each serving for a bread/cereal serving: ice milk, water sherbet (1/4 cup); unflavored gelatin or gelatin flavored with sugar substitute (1/3 cup); pudding prepared with skim milk (1/2 cup); egg white soufflés; unbuttered popcorn (1 ½ cups). Substitute carob for chocolate.  °BEVERAGES Fresh fruit juices (limit 4 oz per day); black coffee, plain or herbal teas; soft drinks with sugar substitutes; club soda, preferably salt-free; cocoa made with skim milk or nonfat dried milk and water (sugar substitute added if desired); clear broth. Alcohol: limit two servings per day (see second page).  °MISCELLANEOUS ° You may use the following freely: vinegar, spices, herbs, nonfat bouillon, mustard, Worcestershire sauce, soy sauce, flavoring essence.  ° ° ° ° ° ° ° ° ° ° ° ° ° ° ° ° °GUIDELINES FOR  °LOW-CHOLESTEROL, LOW TRIGLYCERIDE DIETS  °  °FOODS TO AVOID  ° °MEATS, FISH Marbled beef, pork, bacon, sausage, and other pork products; fatty   fowl (duck, goose); skin and fat of turkey and chicken; processed meats; luncheon meats (salami, bologna); frankfurters and fast-food hamburgers (theyre loaded with fat); organ meats (kidneys, liver); canned fish packed in oil.  °EGGS Limit egg yolks to two per week.   °FRUITS Coconuts (rich in saturated fats).  °VEGETABLES Avoid avocados. Starchy vegetables (potatoes, corn, lima beans, dried peas, beans) may be used only if  substitutes for a serving of bread or cereal. (Baked potato skin, however, is desirable for its fiber content.  °BEANS Commercial baked beans with sugar and/or pork added.  °NUTS Avoid nuts.  Limit peanuts and walnuts to one tablespoonful per day.  °BREADS, GRAINS Any baked goods with shortening and/or sugar. Commercial mixes with dried eggs and whole milk. Avoid sweet rolls, doughnuts, breakfast pastries (Danish), and sweetened packaged cereals (the added sugar converts readily to triglycerides).  °MILK PRODUCTS Whole milk and whole-milk packaged goods; cream; ice cream; whole-milk puddings, yogurt, or cheeses; nondairy cream substitutes.  °FATS, OILS Butter, lard, animal fats, bacon drippings, gravies, cream sauces as well as palm and coconut oils. All these are high in saturated fats. Examine labels on cholesterol free products for hydrogenated fats. (These are oils that have been hardened into solids and in the process have become saturated.)  °DESSERTS, SNACKS Fried snack foods like potato chips; chocolate; candies in general; jams, jellies, syrups; whole- milk puddings; ice cream and milk sherbets; hydrogenated peanut butter.  °BEVERAGES Sugared fruit juices and soft drinks; cocoa made with whole milk and/or sugar. When using alcohol (1 oz liquor, 5 oz beer, or 2 ½ oz dry table wine per serving), one serving must be substituted for one bread or cereal serving (limit, two servings of alcohol per day).  ° SPECIAL NOTES  °  Remember that even non-limited foods should be used in moderation. °While on a cholesterol-lowering diet, be sure to avoid animal fats and marbled meats. °3. While on a triglyceride-lowering diet, be sure to avoid sweets and to control the amount of carbohydrates you eat (starchy foods such as flour, bread, potatoes).While on a tri-glyceride-lowering diet, be sure to avoid sweets °Buy a good low-fat cookbook, such as the one published by the American Heart Association. °Consult your physician  if you have any questions.  ° ° ° ° ° ° ° ° ° ° ° ° ° °Michael Dalton Low Glycemic Diet Plan ° ° °Low Glycemic Foods (20-49) Moderate Glycemic Foods (50-69) High Glycemic Foods (70-100)  °    °Breakfast Creals Breakfast Cereals Breakfast Cereals  °All Bran All-Bran Fruit'n Oats  ° Bran Buds Bran Chex  ° Cheerios Corn chex  °  °Fiber One Oatmeal (not instant)  ° Just Right Mini-Wheats  ° Corn Flakes Cream of Wheat  °  °Oat Bran Special K Swiss Muesli  ° Grape Nuts Grape Nut Flakes  °  °  Grits Nutri-Grain  °  °Fruits and fruit juice: Fruits Puffed Rice Puffed Wheat  °  °(Limit to 1-2 Servings per day) Banana (under-ride) Dates  ° Rice Chex Rice Krispies  °  °Apples Apricots (fresh/dried)  ° Figs Grapes  ° Shredded Wheat Team  °  °Blackberries Blueberries  ° Kiwi Mango  ° Total   °  °Cherries Cranberries  ° Oranges Raisins  °   °Peaches Pears  °  Fruits  °Plums Prunes  ° Fruit Juices Pineapple Watermelon  °  °Grapefruit Raspberries  ° Cranberry Juice Orange Juice  ° Banana (over-ripe)   °  °Strawberries Tangerines  °    °  Apple Juice Grapefruit Juice  ° Beans and Legumes Beverages  °Tomato Juice   ° Boston-type baked beans Sodas, sweet tea, pineapple juice  ° Canned pinto, kidney, or navy beans   °Beans and Legumes (fresh-cooked) Green peas Vegetables  °Black-eyed peas Butter Beans  °  Potato, baked, boiled, fried, mashed  °Chick peas Lentils  ° Vegetables French fries  °Green beans Lima beans  ° Beets Carrots  ° Canned or frozen corn  °Kidney beans Navy beans  ° Sweet potato Yam  ° Parsnips  °Pinto beans Snow peas  ° Corn on the cob Winter squash  °    °Non-starchy vegetables Grains Breads  °Asparagus, avocado, broccoli, cabbage Cornmeal Rice, brown  ° Most breads (white and whole grain)  °cauliflower, celery, cucumber, greens Rice, white Couscous  ° Bagels Bread sticks  °  °lettuce, mushrooms, peppers, tomatoes  Bread stuffing Kaiser roll  °  °okra, onions, spinach, summer squash Pasta Dinner rolls  ° Macaroni  Pizza, cheese  °   °Grains Ravioli, meat filled Spaghetti, white  ° Grains  °Barley Bulgur  °  Rice, instant Tapioca, with milk  °  °Rye Wild rice  ° Nuts   ° Cashews Macadamia  ° Candy and most cookies  °Nuts and oils    °Almonds, peanuts, sunflower seeds Snacks Snacks  °hazelnuts, pecans, walnuts Chocolate Ice cream, lowfat  ° Donuts Corn chips  °  °Oils that are liquid at room temperature Muffin Popcorn  ° Jelly beans Pretzels  °  °  Pastries  °Dairy, fish, meat, soy, and eggs    °Milk, skim Lowfat cheese  °  Restaurant and ethnic foods  °Yogurt, lowfat, fruit sugar sweetened  Most Chinese food (sugar in stir fry  °  or wok sauce)  °Lean red meat Fish  °  Teriyaki-style meats and vegetables  °Skinless chicken and turkey, shellfish    °    °Egg whites (up to 3 daily), Soy Products    °Egg yolks (up to 7 or _____ per week)    °  °

## 2023-03-06 DIAGNOSIS — Z85048 Personal history of other malignant neoplasm of rectum, rectosigmoid junction, and anus: Secondary | ICD-10-CM | POA: Diagnosis not present

## 2023-03-06 DIAGNOSIS — L905 Scar conditions and fibrosis of skin: Secondary | ICD-10-CM | POA: Diagnosis not present

## 2023-03-06 DIAGNOSIS — Z923 Personal history of irradiation: Secondary | ICD-10-CM | POA: Diagnosis not present

## 2023-03-06 DIAGNOSIS — D649 Anemia, unspecified: Secondary | ICD-10-CM | POA: Diagnosis not present

## 2023-03-06 DIAGNOSIS — Z08 Encounter for follow-up examination after completed treatment for malignant neoplasm: Secondary | ICD-10-CM | POA: Diagnosis not present

## 2023-03-06 DIAGNOSIS — G893 Neoplasm related pain (acute) (chronic): Secondary | ICD-10-CM | POA: Diagnosis not present

## 2023-03-06 DIAGNOSIS — C210 Malignant neoplasm of anus, unspecified: Secondary | ICD-10-CM | POA: Diagnosis not present

## 2023-03-06 DIAGNOSIS — F17210 Nicotine dependence, cigarettes, uncomplicated: Secondary | ICD-10-CM | POA: Diagnosis not present

## 2023-03-25 ENCOUNTER — Ambulatory Visit: Payer: 59 | Admitting: Family Medicine

## 2023-03-25 ENCOUNTER — Encounter: Payer: Self-pay | Admitting: Family Medicine

## 2023-03-25 VITALS — BP 128/80 | HR 86 | Ht 73.0 in | Wt 200.0 lb

## 2023-03-25 DIAGNOSIS — F32A Depression, unspecified: Secondary | ICD-10-CM | POA: Diagnosis not present

## 2023-03-25 DIAGNOSIS — I10 Essential (primary) hypertension: Secondary | ICD-10-CM

## 2023-03-25 DIAGNOSIS — F419 Anxiety disorder, unspecified: Secondary | ICD-10-CM | POA: Diagnosis not present

## 2023-03-25 MED ORDER — LOSARTAN POTASSIUM-HCTZ 100-25 MG PO TABS: 1.0000 | ORAL_TABLET | Freq: Every day | ORAL | 1 refills | Status: DC

## 2023-03-25 MED ORDER — METOPROLOL SUCCINATE ER 25 MG PO TB24: 25.0000 mg | ORAL_TABLET | Freq: Every day | ORAL | 1 refills | Status: DC

## 2023-03-25 MED ORDER — SERTRALINE HCL 25 MG PO TABS: 25.0000 mg | ORAL_TABLET | Freq: Every day | ORAL | 1 refills | Status: DC

## 2023-03-25 MED ORDER — AMLODIPINE BESYLATE 10 MG PO TABS: 10.0000 mg | ORAL_TABLET | Freq: Every day | ORAL | 1 refills | Status: DC

## 2023-03-25 NOTE — Progress Notes (Signed)
 Date:  03/25/2023   Name:  Michael Dalton.   DOB:  28-Mar-1960   MRN:  [id number]   Chief Complaint: Hypertension, Anxiety, and Depression  Hypertension This is a chronic problem. The current episode started more than 1 year ago. The problem has been gradually improving since onset. The problem is controlled. Associated symptoms include anxiety. Pertinent negatives include no blurred vision, chest pain, headaches, malaise/fatigue, neck pain, orthopnea, palpitations, peripheral edema, PND, shortness of breath or sweats. There are no associated agents to hypertension. There are no known risk factors for coronary artery disease. Past treatments include calcium  channel blockers, angiotensin blockers, diuretics and beta blockers. The current treatment provides moderate improvement. There are no compliance problems.  There is no history of angina, kidney disease, CAD/MI, CVA, heart failure, left ventricular hypertrophy or PVD. There is no history of chronic renal disease, a hypertension causing med or renovascular disease.  Anxiety Presents for follow-up visit. Patient reports no chest pain, compulsions, decreased concentration, depressed mood, dizziness, excessive worry, feeling of choking, hyperventilation, insomnia, irritability, malaise, nervous/anxious behavior, obsessions, palpitations, panic, restlessness, shortness of breath or suicidal ideas. Symptoms occur occasionally. The severity of symptoms is moderate.    Depression        This is a chronic problem.  The current episode started more than 1 year ago.   The onset quality is sudden.   The problem occurs constantly.  The problem has been gradually improving since onset.  Associated symptoms include no decreased concentration, no fatigue, no helplessness, no hopelessness, does not have insomnia, not irritable, no restlessness, no decreased interest, no appetite change, no body aches, no myalgias, no headaches, no indigestion, not sad and  no suicidal ideas.  Past treatments include SSRIs - Selective serotonin reuptake inhibitors.  Compliance with treatment is variable and good.  Previous treatment provided significant relief.  Past medical history includes anxiety.     Lab Results  Component Value Date   NA 134 09/20/2021   K 4.7 09/20/2021   CO2 23 09/20/2021   GLUCOSE 104 (H) 09/20/2021   BUN 4 (L) 09/20/2021   CREATININE 0.55 (L) 09/20/2021   CALCIUM  9.6 09/20/2021   EGFR 112 09/20/2021   Lab Results  Component Value Date   CHOL 169 01/28/2023   HDL 69 01/28/2023   LDLCALC 87 01/28/2023   TRIG 68 01/28/2023   Lab Results  Component Value Date   TSH 1.870 09/20/2021   No results found for: HGBA1C Lab Results  Component Value Date   WBC 6.3 09/20/2021   HGB 14.3 09/20/2021   HCT 41.3 09/20/2021   MCV 92 09/20/2021   PLT 238 09/20/2021   Lab Results  Component Value Date   ALT 5 09/20/2021   AST 21 09/20/2021   ALKPHOS 60 09/20/2021   BILITOT 0.6 09/20/2021   No results found for: 25OHVITD2, 25OHVITD3, VD25OH   Review of Systems  Constitutional:  Negative for appetite change, fatigue, irritability and malaise/fatigue.  Eyes:  Negative for blurred vision.  Respiratory:  Negative for choking, chest tightness, shortness of breath and wheezing.   Cardiovascular:  Negative for chest pain, palpitations, orthopnea and PND.  Endocrine: Negative for polydipsia and polyuria.  Genitourinary:  Negative for difficulty urinating.  Musculoskeletal:  Negative for myalgias and neck pain.  Neurological:  Negative for dizziness and headaches.  Hematological:  Negative for adenopathy. Does not bruise/bleed easily.  Psychiatric/Behavioral:  Positive for depression. Negative for decreased concentration and suicidal ideas. The patient is  not nervous/anxious and does not have insomnia.     There are no active problems to display for this patient.   Allergies  Allergen Reactions   Oxycodone Itching     History reviewed. No pertinent surgical history.  Social History   Tobacco Use   Smoking status: Every Day    Current packs/day: 1.00    Average packs/day: 1 pack/day for 31.0 years (31.0 ttl pk-yrs)    Types: Cigarettes   Smokeless tobacco: Never  Vaping Use   Vaping status: Never Used  Substance Use Topics   Alcohol use: Yes    Alcohol/week: 3.0 standard drinks of alcohol    Types: 3 Cans of beer per week    Comment: 3 beers/ daily   Drug use: Never     Medication list has been reviewed and updated.  Current Meds  Medication Sig   amLODipine  (NORVASC ) 10 MG tablet Take 1 tablet (10 mg total) by mouth daily.   losartan -hydrochlorothiazide  (HYZAAR) 100-25 MG tablet Take 1 tablet by mouth daily.   metoprolol  succinate (TOPROL -XL) 25 MG 24 hr tablet Take 1 tablet (25 mg total) by mouth daily.   sertraline  (ZOLOFT ) 25 MG tablet Take 1 tablet (25 mg total) by mouth daily.       03/25/2023    9:44 AM 01/28/2023    8:46 AM 10/08/2022   10:45 AM 04/09/2022   10:05 AM  GAD 7 : Generalized Anxiety Score  Nervous, Anxious, on Edge 0 1 0 0  Control/stop worrying 0 0 0 0  Worry too much - different things 0 0 0 0  Trouble relaxing 0 0 0 0  Restless 0 0 0 0  Easily annoyed or irritable 0 1 0 0  Afraid - awful might happen 0 0 0 0  Total GAD 7 Score 0 2 0 0  Anxiety Difficulty Not difficult at all Not difficult at all Not difficult at all Not difficult at all       03/25/2023    9:44 AM 01/28/2023    8:46 AM 10/08/2022   10:45 AM  Depression screen PHQ 2/9  Decreased Interest 0 0 0  Down, Depressed, Hopeless 0 0 0  PHQ - 2 Score 0 0 0  Altered sleeping 0 0 0  Tired, decreased energy 0 1 0  Change in appetite 0 0 0  Feeling bad or failure about yourself  0 0 0  Trouble concentrating 0 0 0  Moving slowly or fidgety/restless 0 1 0  Suicidal thoughts 0 0 0  PHQ-9 Score 0 2 0  Difficult doing work/chores Not difficult at all Not difficult at all Not difficult at all     BP Readings from Last 3 Encounters:  03/25/23 128/80  01/28/23 120/76  10/08/22 100/68    Physical Exam Vitals and nursing note reviewed.  Constitutional:      General: He is not irritable.    Appearance: He is well-developed.  HENT:     Head: Normocephalic and atraumatic.     Right Ear: Tympanic membrane, ear canal and external ear normal.     Left Ear: Tympanic membrane, ear canal and external ear normal.     Nose: Nose normal. No congestion or rhinorrhea.     Mouth/Throat:     Mouth: Mucous membranes are moist.     Dentition: Normal dentition.     Pharynx: No oropharyngeal exudate.  Eyes:     General: Lids are normal. No scleral icterus.    Conjunctiva/sclera:  Conjunctivae normal.     Pupils: Pupils are equal, round, and reactive to light.  Neck:     Thyroid : No thyromegaly.     Vascular: No carotid bruit, hepatojugular reflux or JVD.     Trachea: No tracheal deviation.  Cardiovascular:     Rate and Rhythm: Normal rate and regular rhythm.     Heart sounds: Normal heart sounds. No murmur heard.    No friction rub. No gallop.  Pulmonary:     Effort: Pulmonary effort is normal. No respiratory distress.     Breath sounds: Normal breath sounds. No wheezing, rhonchi or rales.  Abdominal:     General: Bowel sounds are normal.     Palpations: Abdomen is soft. There is no hepatomegaly, splenomegaly or mass.     Tenderness: There is no abdominal tenderness.     Hernia: There is no hernia in the left inguinal area.  Musculoskeletal:        General: Normal range of motion.     Cervical back: Normal range of motion and neck supple.  Lymphadenopathy:     Cervical: No cervical adenopathy.  Skin:    General: Skin is warm and dry.     Findings: No rash.  Neurological:     Mental Status: He is alert and oriented to person, place, and time.     Sensory: No sensory deficit.     Deep Tendon Reflexes: Reflexes are normal and symmetric.  Psychiatric:        Mood and Affect:  Mood is not anxious or depressed.     Wt Readings from Last 3 Encounters:  03/25/23 200 lb (90.7 kg)  01/28/23 198 lb 3.2 oz (89.9 kg)  10/08/22 190 lb (86.2 kg)    BP 128/80   Pulse 86   Ht 6' 1 (1.854 m)   Wt 200 lb (90.7 kg)   SpO2 98%   BMI 26.39 kg/m   Assessment and Plan:  1. Primary hypertension Chronic.  Controlled.  Stable.  Blood pressure today 128/80.  Asymptomatic.  Tolerating medication well.  Continue amlodipine  10 mg once a day, losartan  hydrochlorothiazide  100-25 mg once a day and metoprolol  XL 25 mg once a day.  Will recheck in 6 months.  Review of CMP from [facility] from 03/06/2023 is acceptable. - amLODipine  (NORVASC ) 10 MG tablet; Take 1 tablet (10 mg total) by mouth daily.  Dispense: 90 tablet; Refill: 1 - losartan -hydrochlorothiazide  (HYZAAR) 100-25 MG tablet; Take 1 tablet by mouth daily.  Dispense: 90 tablet; Refill: 1 - metoprolol  succinate (TOPROL -XL) 25 MG 24 hr tablet; Take 1 tablet (25 mg total) by mouth daily.  Dispense: 90 tablet; Refill: 1  2. Anxiety and depression (Primary) Chronic.  Controlled.  Stable.  Asymptomatic.SABRA  PHQ was 0.  GAD score is 0.  Continue sertraline  25 mg once a day.  Will recheck patient in 6 months. - sertraline  (ZOLOFT ) 25 MG tablet; Take 1 tablet (25 mg total) by mouth daily.  Dispense: 90 tablet; Refill: 1    Cathryne Molt, MD

## 2023-06-02 DIAGNOSIS — C210 Malignant neoplasm of anus, unspecified: Secondary | ICD-10-CM | POA: Diagnosis not present

## 2023-06-02 DIAGNOSIS — N3289 Other specified disorders of bladder: Secondary | ICD-10-CM | POA: Diagnosis not present

## 2023-06-02 DIAGNOSIS — C220 Liver cell carcinoma: Secondary | ICD-10-CM | POA: Diagnosis not present

## 2023-06-04 DIAGNOSIS — C210 Malignant neoplasm of anus, unspecified: Secondary | ICD-10-CM | POA: Diagnosis not present

## 2023-06-05 DIAGNOSIS — Z85048 Personal history of other malignant neoplasm of rectum, rectosigmoid junction, and anus: Secondary | ICD-10-CM | POA: Diagnosis not present

## 2023-06-05 DIAGNOSIS — Z08 Encounter for follow-up examination after completed treatment for malignant neoplasm: Secondary | ICD-10-CM | POA: Diagnosis not present

## 2023-06-05 DIAGNOSIS — Z923 Personal history of irradiation: Secondary | ICD-10-CM | POA: Diagnosis not present

## 2023-06-05 DIAGNOSIS — C210 Malignant neoplasm of anus, unspecified: Secondary | ICD-10-CM | POA: Diagnosis not present

## 2023-06-11 ENCOUNTER — Other Ambulatory Visit: Payer: Self-pay | Admitting: Family Medicine

## 2023-06-11 DIAGNOSIS — F419 Anxiety disorder, unspecified: Secondary | ICD-10-CM

## 2023-06-11 DIAGNOSIS — I10 Essential (primary) hypertension: Secondary | ICD-10-CM

## 2023-06-11 NOTE — Telephone Encounter (Signed)
 Copied from CRM [id number]. Topic: Clinical - Medication Refill >> Jun 11, 2023 11:40 AM Michael Dalton wrote: Most Recent Primary Care Visit:  Provider: Clarise Crooks  Department: [facility]  Visit Type: OFFICE VISIT  Date: 03/25/2023  Medication:  amLODipine  (NORVASC ) 10 MG tablet, losartan -hydrochlorothiazide  (HYZAAR) 100-25 MG tablet, metoprolol  succinate (TOPROL -XL) 25 MG 24 hr tablet and sertraline  (ZOLOFT ) 25 MG tablet   Has the patient contacted their pharmacy? No  Is this the correct pharmacy for this prescription? Yes If no, delete pharmacy and type the correct one.  This is the patient's preferred pharmacy:  [id number] - [place], [place] - [address] [place] [address] Phone: [phone] Fax: [phone]   Has the prescription been filled recently? Yes  Is the patient out of the medication? Yes  Has the patient been seen for an appointment in the last year OR does the patient have an upcoming appointment? Yes  Can we respond through MyChart? No  Agent: Please be advised that Rx refills may take up to 3 business days. We ask that you follow-up with your pharmacy.

## 2023-06-12 NOTE — Telephone Encounter (Signed)
 Requested medication (s) are due for refill today: no  Requested medication (s) are on the active medication list: yes  Last refill:  03/25/23 #90 1 RF  Future visit scheduled: yes  Notes to clinic:  overdue lab work   Requested Prescriptions  Pending Prescriptions Disp Refills   losartan -hydrochlorothiazide  (HYZAAR) 100-25 MG tablet 90 tablet     Sig: Take 1 tablet by mouth daily.     Cardiovascular: ARB + Diuretic Combos Failed - 06/12/2023 10:47 AM      Failed - K in normal range and within 180 days    Potassium  Date Value Ref Range Status  09/20/2021 4.7 3.5 - 5.2 mmol/L Final         Failed - Na in normal range and within 180 days    Sodium  Date Value Ref Range Status  09/20/2021 134 134 - 144 mmol/L Final         Failed - Cr in normal range and within 180 days    Creatinine, Ser  Date Value Ref Range Status  09/20/2021 0.55 (L) 0.76 - 1.27 mg/dL Final         Failed - eGFR is 10 or above and within 180 days    eGFR  Date Value Ref Range Status  09/20/2021 112 >59 mL/min/1.73 Final         Passed - Patient is not pregnant      Passed - Last BP in normal range    BP Readings from Last 1 Encounters:  03/25/23 128/80         Passed - Valid encounter within last 6 months    Recent Outpatient Visits           2 months ago Anxiety and depression   [place] [facility] Kayla Part, MD       Future Appointments             In 2 months Larkin Plumb Arleen Lacer, PA [facility] at [facility], [facility]            Refused Prescriptions Disp Refills   amLODipine  (NORVASC ) 10 MG tablet 90 tablet     Sig: Take 1 tablet (10 mg total) by mouth daily.     Cardiovascular: Calcium Channel Blockers 2 Passed - 06/12/2023 10:47 AM      Passed - Last BP in normal range    BP Readings from Last 1 Encounters:  03/25/23 128/80         Passed - Last Heart Rate in normal range    Pulse Readings  from Last 1 Encounters:  03/25/23 86         Passed - Valid encounter within last 6 months    Recent Outpatient Visits           2 months ago Anxiety and depression   [place] [facility]la Part, MD       Future Appointments             In 2 months Larkin Plumb, Arleen Lacer, PA [facility] at [facility]             metoprolol  succinate (TOPROL -XL) 25 MG 24 hr tablet 90 tablet     Sig: Take 1 tablet (25 mg total) by mouth daily.     Cardiovascular:  Beta Blockers Passed - 06/12/2023  10:47 AM      Passed - Last BP in normal range    BP Readings from Last 1 Encounters:  03/25/23 128/80         Passed - Last Heart Rate in normal range    Pulse Readings from Last 1 Encounters:  03/25/23 86         Passed - Valid encounter within last 6 months    Recent Outpatient Visits           2 months ago Anxiety and depression   [place] [facility] Part, MD       Future Appointments             In 2 months Larkin Plumb, Arleen Lacer, PA [facility] at [facility]             sertraline  (ZOLOFT ) 25 MG tablet 90 tablet     Sig: Take 1 tablet (25 mg total) by mouth daily.     Psychiatry:  Antidepressants - SSRI - sertraline  Failed - 06/12/2023 10:47 AM      Failed - AST in normal range and within 360 days    AST  Date Value Ref Range Status  09/20/2021 21 0 - 40 IU/L Final         Failed - ALT in normal range and within 360 days    ALT  Date Value Ref Range Status  09/20/2021 5 0 - 44 IU/L Final         Passed - Completed PHQ-2 or PHQ-9 in the last 360 days      Passed - Valid encounter within last 6 months    Recent Outpatient Visits           2 months ago Anxiety and depression   [place] [facility] Part, MD       Future  Appointments             In 2 months [place], Arleen Lacer, PA [facility] at [facility], [facility]

## 2023-06-12 NOTE — Telephone Encounter (Signed)
 Requested Prescriptions  Pending Prescriptions Disp Refills   amLODipine  (NORVASC ) 10 MG tablet 90 tablet     Sig: Take 1 tablet (10 mg total) by mouth daily.     Cardiovascular: Calcium Channel Blockers 2 Passed - 06/12/2023 10:44 AM      Passed - Last BP in normal range    BP Readings from Last 1 Encounters:  03/25/23 128/80         Passed - Last Heart Rate in normal range    Pulse Readings from Last 1 Encounters:  03/25/23 86         Passed - Valid encounter within last 6 months    Recent Outpatient Visits           2 months ago Anxiety and depression   [place] [facility], [facility]       Future Appointments             In 2 months Larkin Plumb, Arleen Lacer, PA [facility] at [facility], [facility]             losartan -hydrochlorothiazide  (HYZAAR) 100-25 MG tablet 90 tablet     Sig: Take 1 tablet by mouth daily.     Cardiovascular: ARB + Diuretic Combos Failed - 06/12/2023 10:44 AM      Failed - K in normal range and within 180 days    Potassium  Date Value Ref Range Status  09/20/2021 4.7 3.5 - 5.2 mmol/L Final         Failed - Na in normal range and within 180 days    Sodium  Date Value Ref Range Status  09/20/2021 134 134 - 144 mmol/L Final         Failed - Cr in normal range and within 180 days    Creatinine, Ser  Date Value Ref Range Status  09/20/2021 0.55 (L) 0.76 - 1.27 mg/dL Final         Failed - eGFR is 10 or above and within 180 days    eGFR  Date Value Ref Range Status  09/20/2021 112 >59 mL/min/1.73 Final         Passed - Patient is not pregnant      Passed - Last BP in normal range    BP Readings from Last 1 Encounters:  03/25/23 128/80         Passed - Valid encounter within last 6 months    Recent Outpatient Visits           2 months ago Anxiety and depression   [place] [facility], [facility]        Future Appointments             In 2 months Larkin Plumb Arleen Lacer, PA [facility] at [facility], [facility]            Refused Prescriptions Disp Refills   metoprolol  succinate (TOPROL -XL) 25 MG 24 hr tablet 90 tablet     Sig: Take 1 tablet (25 mg total) by mouth daily.     Cardiovascular:  Beta Blockers Passed - 06/12/2023 10:44 AM      Passed - Last BP in normal range    BP Readings from Last 1 Encounters:  03/25/23 128/80         Passed - Last Heart Rate in normal range  Pulse Readings from Last 1 Encounters:  03/25/23 86         Passed - Valid encounter within last 6 months    Recent Outpatient Visits           2 months ago Anxiety and depression   [place] [facility], [facility]       Future Appointments             In 2 months Larkin Plumb, Arleen Lacer, PA [facility] Primary Care & Sports Medicine at [facility], [facility]             sertraline  (ZOLOFT ) 25 MG tablet 90 tablet     Sig: Take 1 tablet (25 mg total) by mouth daily.     Psychiatry:  Antidepressants - SSRI - sertraline  Failed - 06/12/2023 10:44 AM      Failed - AST in normal range and within 360 days    AST  Date Value Ref Range Status  09/20/2021 21 0 - 40 IU/L Final         Failed - ALT in normal range and within 360 days    ALT  Date Value Ref Range Status  09/20/2021 5 0 - 44 IU/L Final         Passed - Completed PHQ-2 or PHQ-9 in the last 360 days      Passed - Valid encounter within last 6 months    Recent Outpatient Visits           2 months ago Anxiety and depression   [place] [facility], [facility]       Future Appointments             In 2 months [place], Arleen Lacer, PA [facility] at [facility], [facility]

## 2023-06-27 NOTE — Telephone Encounter (Unsigned)
 Copied from CRM [id number]. Topic: Clinical - Medication Refill >> Jun 27, 2023  9:27 AM Crispin Dolphin wrote: Medication:  amLODipine  (NORVASC ) 10 MG tablet losartan -hydrochlorothiazide  (HYZAAR) 100-25 MG tablet metoprolol  succinate (TOPROL -XL) 25 MG 24 hr tablet sertraline  (ZOLOFT ) 25 MG tablet  Has the patient contacted their pharmacy? Yes (Agent: If no, request that the patient contact the pharmacy for the refill. If patient does not wish to contact the pharmacy document the reason why and proceed with request.) (Agent: If yes, when and what did the pharmacy advise?) has not received medication when it was requested - let him know was denied for too soon   This is the patient's preferred pharmacy:  [id number] - [place], [place] - [address] [place] [address] Phone: [phone] Fax: [phone]  Is this the correct pharmacy for this prescription? Yes If no, delete pharmacy and type the correct one.   Has the prescription been filled recently? No  Is the patient out of the medication? Yes  Has the patient been seen for an appointment in the last year OR does the patient have an upcoming appointment? Yes  Can we respond through MyChart? No  Agent: Please be advised that Rx refills may take up to 3 business days. We ask that you follow-up with your pharmacy.

## 2023-08-25 ENCOUNTER — Ambulatory Visit: Payer: 59 | Admitting: Physician Assistant

## 2023-09-03 ENCOUNTER — Encounter: Payer: Self-pay | Admitting: Physician Assistant

## 2023-09-03 ENCOUNTER — Ambulatory Visit (INDEPENDENT_AMBULATORY_CARE_PROVIDER_SITE_OTHER): Admitting: Physician Assistant

## 2023-09-03 VITALS — BP 118/80 | HR 89 | Temp 97.6°F | Ht 73.0 in | Wt 207.0 lb

## 2023-09-03 DIAGNOSIS — J432 Centrilobular emphysema: Secondary | ICD-10-CM

## 2023-09-03 DIAGNOSIS — F32A Depression, unspecified: Secondary | ICD-10-CM | POA: Diagnosis not present

## 2023-09-03 DIAGNOSIS — C210 Malignant neoplasm of anus, unspecified: Secondary | ICD-10-CM | POA: Diagnosis not present

## 2023-09-03 DIAGNOSIS — E538 Deficiency of other specified B group vitamins: Secondary | ICD-10-CM | POA: Insufficient documentation

## 2023-09-03 DIAGNOSIS — I10 Essential (primary) hypertension: Secondary | ICD-10-CM | POA: Diagnosis not present

## 2023-09-03 DIAGNOSIS — D649 Anemia, unspecified: Secondary | ICD-10-CM | POA: Insufficient documentation

## 2023-09-03 DIAGNOSIS — I2510 Atherosclerotic heart disease of native coronary artery without angina pectoris: Secondary | ICD-10-CM | POA: Insufficient documentation

## 2023-09-03 DIAGNOSIS — J439 Emphysema, unspecified: Secondary | ICD-10-CM | POA: Insufficient documentation

## 2023-09-03 DIAGNOSIS — F419 Anxiety disorder, unspecified: Secondary | ICD-10-CM | POA: Insufficient documentation

## 2023-09-03 DIAGNOSIS — Z8709 Personal history of other diseases of the respiratory system: Secondary | ICD-10-CM | POA: Insufficient documentation

## 2023-09-03 DIAGNOSIS — F17200 Nicotine dependence, unspecified, uncomplicated: Secondary | ICD-10-CM | POA: Diagnosis not present

## 2023-09-03 MED ORDER — SERTRALINE HCL 50 MG PO TABS: 50.0000 mg | ORAL_TABLET | Freq: Every day | ORAL | 1 refills | Status: DC

## 2023-09-03 MED ORDER — AMLODIPINE BESYLATE 10 MG PO TABS: 10.0000 mg | ORAL_TABLET | Freq: Every day | ORAL | 1 refills | Status: DC

## 2023-09-03 MED ORDER — METOPROLOL SUCCINATE ER 25 MG PO TB24: 25.0000 mg | ORAL_TABLET | Freq: Every day | ORAL | 1 refills | Status: DC

## 2023-09-03 MED ORDER — LOSARTAN POTASSIUM-HCTZ 100-25 MG PO TABS: 1.0000 | ORAL_TABLET | Freq: Every day | ORAL | 1 refills | Status: DC

## 2023-09-03 MED ORDER — ROSUVASTATIN CALCIUM 5 MG PO TABS: 5.0000 mg | ORAL_TABLET | Freq: Every day | ORAL | 1 refills | Status: DC

## 2023-09-03 NOTE — Progress Notes (Signed)
 Date:  09/03/2023   Name:  Michael Dalton.   DOB:  11/10/1960   MRN:  [id number]   Chief Complaint: Anxiety (Wants a increase for sertraline  ), Depression, and Hypertension  HPI Michael Dalton is a 63 year old male with a history of HTN, HLD, chronic anemia, and anal cancer who presents to me today as a transfer of care from my recently retired Animator Dr. Joshua.   He is closely followed by general surgery and oncology for monitoring of anal cancer, with good progress following excision of the lesion and no evidence of recurrence/metastasis. He will have ongoing monitoring with Gen Surg q3-48m, next visit with them 09/11/23. Sees oncology 10/06/23 as well.   Still smoking 1 ppd, knows he should quit but is not quite ready. No prior quit attempts.   Overall he feels quite well. He cares for his mother who has dementia, which places a good deal of stress on him. Requests dose increase on sertraline , has not taken it in about a month.    Medication list has been reviewed and updated.  Current Meds  Medication Sig   rosuvastatin  (CRESTOR ) 5 MG tablet Take 1 tablet (5 mg total) by mouth daily.   [DISCONTINUED] amLODipine  (NORVASC ) 10 MG tablet Take 1 tablet (10 mg total) by mouth daily.   [DISCONTINUED] losartan -hydrochlorothiazide  (HYZAAR) 100-25 MG tablet Take 1 tablet by mouth daily.   [DISCONTINUED] metoprolol  succinate (TOPROL -XL) 25 MG 24 hr tablet Take 1 tablet (25 mg total) by mouth daily.   [DISCONTINUED] sertraline  (ZOLOFT ) 25 MG tablet Take 1 tablet (25 mg total) by mouth daily.     Review of Systems  Patient Active Problem List   Diagnosis Date Noted   B12 deficiency 09/03/2023   Chronic anemia 09/03/2023   Anxiety and depression 09/03/2023   History of asthma 09/03/2023   Tobacco use disorder 09/03/2023   Emphysema/COPD (HCC) 09/03/2023   Coronary artery disease 09/03/2023   Anal cancer (HCC) 03/05/2022   Myopia of both eyes 01/19/2019   Nuclear sclerotic cataract  of both eyes 01/19/2019   Vitreous floater, bilateral 01/19/2019   Hypertriglyceridemia 11/02/2013   Primary hypertension 10/30/2013   Seasonal allergic rhinitis 10/30/2013   Chronic leukopenia 10/30/2013    Allergies  Allergen Reactions   Oxycodone Itching    Immunization History  Administered Date(s) Administered   Hepatitis A, Adult 01/16/2006, 07/21/2008   Influenza Inj Mdck Quad Pf 11/30/2018   Influenza, Seasonal, Injecte, Preservative Fre 01/28/2023   Influenza,inj,Quad PF,6+ Mos 01/08/2022   Influenza-Unspecified 12/16/2014, 12/27/2015   PFIZER Comirnaty(Gray Top)Covid-19 Tri-Sucrose Vaccine 05/02/2019, 05/22/2019, 04/07/2020   Pfizer Covid-19 Vaccine Bivalent Booster 22yrs & up 01/03/2021   Pfizer(Comirnaty)Fall Seasonal Vaccine 12 years and older 01/28/2023   Tdap 11/10/2014   Zoster Recombinant(Shingrix ) 01/24/2022, 04/09/2022    History reviewed. No pertinent surgical history.  Social History   Tobacco Use   Smoking status: Every Day    Current packs/day: 1.00    Average packs/day: 1 pack/day for 31.0 years (31.0 ttl pk-yrs)    Types: Cigarettes   Smokeless tobacco: Never  Vaping Use   Vaping status: Never Used  Substance Use Topics   Alcohol use: Yes    Alcohol/week: 3.0 standard drinks of alcohol    Types: 3 Cans of beer per week    Comment: 3 beers/ daily   Drug use: Never    Family History  Problem Relation Age of Onset   Stroke Mother    Cancer Father    Hypertension  Sister         09/03/2023    1:37 PM 03/25/2023    9:44 AM 01/28/2023    8:46 AM 10/08/2022   10:45 AM  GAD 7 : Generalized Anxiety Score  Nervous, Anxious, on Edge 0 0 1 0  Control/stop worrying 0 0 0 0  Worry too much - different things 0 0 0 0  Trouble relaxing 0 0 0 0  Restless 0 0 0 0  Easily annoyed or irritable 0 0 1 0  Afraid - awful might happen 0 0 0 0  Total GAD 7 Score 0 0 2 0  Anxiety Difficulty Not difficult at all Not difficult at all Not difficult at all  Not difficult at all       09/03/2023    1:37 PM 03/25/2023    9:44 AM 01/28/2023    8:46 AM  Depression screen PHQ 2/9  Decreased Interest 0 0 0  Down, Depressed, Hopeless 0 0 0  PHQ - 2 Score 0 0 0  Altered sleeping 0 0 0  Tired, decreased energy 0 0 1  Change in appetite 0 0 0  Feeling bad or failure about yourself  0 0 0  Trouble concentrating 0 0 0  Moving slowly or fidgety/restless 0 0 1  Suicidal thoughts 0 0 0  PHQ-9 Score 0 0 2  Difficult doing work/chores Not difficult at all Not difficult at all Not difficult at all    BP Readings from Last 3 Encounters:  09/03/23 118/80  03/25/23 128/80  01/28/23 120/76    Wt Readings from Last 3 Encounters:  09/03/23 207 lb (93.9 kg)  03/25/23 200 lb (90.7 kg)  01/28/23 198 lb 3.2 oz (89.9 kg)    BP 118/80   Pulse 89   Temp 97.6 F (36.4 C)   Ht 6' 1 (1.854 m)   Wt 207 lb (93.9 kg)   SpO2 99%   BMI 27.31 kg/m   Physical Exam Vitals and nursing note reviewed.  Constitutional:      Appearance: Normal appearance.  Neck:     Vascular: No carotid bruit.  Cardiovascular:     Rate and Rhythm: Normal rate and regular rhythm.     Heart sounds: No murmur heard.    No friction rub. No gallop.  Pulmonary:     Effort: Pulmonary effort is normal.     Breath sounds: Decreased air movement present. No wheezing, rhonchi or rales.  Abdominal:     General: There is no distension.  Musculoskeletal:        General: Normal range of motion.  Skin:    General: Skin is warm and dry.  Neurological:     Mental Status: He is alert and oriented to person, place, and time.     Gait: Gait is intact.  Psychiatric:        Mood and Affect: Mood and affect normal.     Recent Labs     Component Value Date/Time   NA 134 09/20/2021 1509   K 4.7 09/20/2021 1509   CL 93 (L) 09/20/2021 1509   CO2 23 09/20/2021 1509   GLUCOSE 104 (H) 09/20/2021 1509   BUN 4 (L) 09/20/2021 1509   CREATININE 0.55 (L) 09/20/2021 1509   CALCIUM  9.6  09/20/2021 1509   ALBUMIN 4.5 09/20/2021 1509   AST 21 09/20/2021 1509   ALT 5 09/20/2021 1509   ALKPHOS 60 09/20/2021 1509   BILITOT 0.6 09/20/2021 1509    Lab  Results  Component Value Date   WBC 6.3 09/20/2021   HGB 14.3 09/20/2021   HCT 41.3 09/20/2021   MCV 92 09/20/2021   PLT 238 09/20/2021   No results found for: HGBA1C Lab Results  Component Value Date   CHOL 169 01/28/2023   HDL 69 01/28/2023   LDLCALC 87 01/28/2023   TRIG 68 01/28/2023   Lab Results  Component Value Date   TSH 1.870 09/20/2021     Assessment and Plan:  Anxiety and depression Assessment & Plan: Increase sertraline  dose to 50 mg daily.  Patient advised to likely take about 2 weeks before he notices much difference.  Orders: -     Sertraline  HCl; Take 1 tablet (50 mg total) by mouth daily.  Dispense: 90 tablet; Refill: 1  Anal cancer [facility]) Assessment & Plan: Specialist notes reviewed.  Continue follow-up as planned with oncology and general surgery.   Primary hypertension Assessment & Plan: Well-controlled with current regimen, refilling medications as below.  Orders: -     amLODIPine  Besylate; Take 1 tablet (10 mg total) by mouth daily.  Dispense: 90 tablet; Refill: 1 -     Losartan  Potassium-HCTZ; Take 1 tablet by mouth daily.  Dispense: 90 tablet; Refill: 1 -     Metoprolol  Succinate ER; Take 1 tablet (25 mg total) by mouth daily.  Dispense: 90 tablet; Refill: 1  Tobacco use disorder Assessment & Plan: Encouraged cessation, informed patient of the [place] system smoking cessation program.  He is contemplative, but not quite ready to make a quit attempt.   Centrilobular emphysema (HCC) Assessment & Plan: PET scan from last year showed signs of mild centrilobular emphysema.  Counseled on smoking cessation.   Coronary artery disease involving native coronary artery of native heart without angina pectoris Assessment & Plan: The 10-year ASCVD risk score (Arnett DK, et al.,  2019) is: 12.4%   Severe atherosclerotic plaque of coronary arteries noted on PET scan from 2024.  Patient would benefit from statin therapy regardless of his lipid panel. He is willing to try rosuvastatin  5 mg daily.   Orders: -     Rosuvastatin  Calcium ; Take 1 tablet (5 mg total) by mouth daily.  Dispense: 90 tablet; Refill: 1    Return in about 6 months (around 03/05/2024) for OV f/u chronic conditions.    Rolan Hoyle, PA-C, DMSc, Nutritionist [facility] Primary [facility] and [facility] Medicine [facility] [phone]

## 2023-09-03 NOTE — Assessment & Plan Note (Signed)
 Increase sertraline  dose to 50 mg daily.  Patient advised to likely take about 2 weeks before he notices much difference.

## 2023-09-03 NOTE — Assessment & Plan Note (Addendum)
 Specialist notes reviewed.  Continue follow-up as planned with oncology and general surgery.

## 2023-09-03 NOTE — Assessment & Plan Note (Signed)
 Encouraged cessation, informed patient of the [place] system smoking cessation program.  He is contemplative, but not quite ready to make a quit attempt.

## 2023-09-03 NOTE — Assessment & Plan Note (Addendum)
 The 10-year ASCVD risk score (Arnett DK, et al., 2019) is: 12.4%   Severe atherosclerotic plaque of coronary arteries noted on PET scan from 2024.  Patient would benefit from statin therapy regardless of his lipid panel. He is willing to try rosuvastatin  5 mg daily.

## 2023-09-03 NOTE — Assessment & Plan Note (Signed)
 PET scan from last year showed signs of mild centrilobular emphysema.  Counseled on smoking cessation.

## 2023-09-03 NOTE — Assessment & Plan Note (Signed)
 Well-controlled with current regimen, refilling medications as below.

## 2023-09-11 DIAGNOSIS — C210 Malignant neoplasm of anus, unspecified: Secondary | ICD-10-CM | POA: Diagnosis not present

## 2023-10-24 DIAGNOSIS — D649 Anemia, unspecified: Secondary | ICD-10-CM | POA: Diagnosis not present

## 2023-10-24 DIAGNOSIS — C210 Malignant neoplasm of anus, unspecified: Secondary | ICD-10-CM | POA: Diagnosis not present

## 2023-10-24 DIAGNOSIS — N3289 Other specified disorders of bladder: Secondary | ICD-10-CM | POA: Diagnosis not present

## 2023-12-18 DIAGNOSIS — C210 Malignant neoplasm of anus, unspecified: Secondary | ICD-10-CM | POA: Diagnosis not present

## 2024-01-24 ENCOUNTER — Other Ambulatory Visit: Payer: Self-pay | Admitting: Physician Assistant

## 2024-01-24 DIAGNOSIS — F419 Anxiety disorder, unspecified: Secondary | ICD-10-CM

## 2024-01-27 NOTE — Telephone Encounter (Signed)
 Requested medication (s) are due for refill today: Yes  Requested medication (s) are on the active medication list: Yes  Last refill:  09/03/23  Future visit scheduled: Yes  Notes to clinic:  See pharmacy requests.    Requested Prescriptions  Pending Prescriptions Disp Refills   sertraline  (ZOLOFT ) 50 MG tablet [Pharmacy Med Name: SERTRALINE  HCL 50 MG TABLET] 90 tablet 2    Sig: TAKE 1 TABLET BY MOUTH EVERY DAY     Psychiatry:  Antidepressants - SSRI - sertraline  Failed - 01/27/2024 11:43 AM      Failed - AST in normal range and within 360 days    AST  Date Value Ref Range Status  09/20/2021 21 0 - 40 IU/L Final         Failed - ALT in normal range and within 360 days    ALT  Date Value Ref Range Status  09/20/2021 5 0 - 44 IU/L Final         Passed - Completed PHQ-2 or PHQ-9 in the last 360 days      Passed - Valid encounter within last 6 months    Recent Outpatient Visits           4 months ago Anxiety and depression   [facility] at [facility], Toribio SQUIBB, [place]   10 months ago Anxiety and depression   [facility] at [facility] Cathryne JAYSON, MD

## 2024-03-01 ENCOUNTER — Other Ambulatory Visit: Payer: Self-pay | Admitting: Physician Assistant

## 2024-03-01 DIAGNOSIS — F419 Anxiety disorder, unspecified: Secondary | ICD-10-CM

## 2024-03-01 DIAGNOSIS — I2510 Atherosclerotic heart disease of native coronary artery without angina pectoris: Secondary | ICD-10-CM

## 2024-03-02 NOTE — Telephone Encounter (Signed)
 Requested medication (s) are due for refill today - yes  Requested medication (s) are on the active medication list -yes  Future visit scheduled -yes  Last refill: both- 09/03/23 #90 1RF  Notes to clinic: fails lab protocol- over 1 year-09/20/21, 01/28/23  Requested Prescriptions  Pending Prescriptions Disp Refills   sertraline  (ZOLOFT ) 50 MG tablet [Pharmacy Med Name: SERTRALINE  HCL 50 MG TABLET] 30 tablet 5    Sig: TAKE 1 TABLET BY MOUTH EVERY DAY     Psychiatry:  Antidepressants - SSRI - sertraline  Failed - 03/02/2024 10:05 AM      Failed - AST in normal range and within 360 days    AST  Date Value Ref Range Status  09/20/2021 21 0 - 40 IU/L Final         Failed - ALT in normal range and within 360 days    ALT  Date Value Ref Range Status  09/20/2021 5 0 - 44 IU/L Final         Failed - Valid encounter within last 6 months    Recent Outpatient Visits           6 months ago Anxiety and depression   [place] [facility] at [facility], Toribio SQUIBB, [place]   11 months ago Anxiety and depression   [place] [facility] at [facility], MD              Passed - Completed PHQ-2 or PHQ-9 in the last 360 days       rosuvastatin  (CRESTOR ) 5 MG tablet [Pharmacy Med Name: ROSUVASTATIN  CALCIUM  5 MG TAB] 30 tablet 5    Sig: Take 1 tablet (5 mg total) by mouth daily.     Cardiovascular:  Antilipid - Statins 2 Failed - 03/02/2024 10:05 AM      Failed - Cr in normal range and within 360 days    Creatinine, Ser  Date Value Ref Range Status  09/20/2021 0.55 (L) 0.76 - 1.27 mg/dL Final         Failed - Lipid Panel in normal range within the last 12 months    Cholesterol, Total  Date Value Ref Range Status  01/28/2023 169 100 - 199 mg/dL Final   LDL Chol Calc (NIH)  Date Value Ref Range Status  01/28/2023 87 0 - 99 mg/dL Final   HDL  Date Value Ref Range Status  01/28/2023 69 >39 mg/dL Final    Triglycerides  Date Value Ref Range Status  01/28/2023 68 0 - 149 mg/dL Final         Passed - Patient is not pregnant      Passed - Valid encounter within last 12 months    Recent Outpatient Visits           6 months ago Anxiety and depression   [place] [facility] Medicine at [facility], Toribio SQUIBB, [place]   11 months ago Anxiety and depression    [facility] at [facility] Cathryne JAYSON, [place]                 Requested Prescriptions  Pending Prescriptions Disp Refills   sertraline  (ZOLOFT ) 50 MG tablet [Pharmacy Med Name: SERTRALINE  HCL 50 MG TABLET] 30 tablet 5    Sig: TAKE 1 TABLET BY MOUTH EVERY DAY     Psychiatry:  Antidepressants - SSRI - sertraline  Failed - 03/02/2024  10:05 AM      Failed - AST in normal range and within 360 days    AST  Date Value Ref Range Status  09/20/2021 21 0 - 40 IU/L Final         Failed - ALT in normal range and within 360 days    ALT  Date Value Ref Range Status  09/20/2021 5 0 - 44 IU/L Final         Failed - Valid encounter within last 6 months    Recent Outpatient Visits           6 months ago Anxiety and depression   [place] [facility] at [facility] Toribio SQUIBB, [place]   11 months ago Anxiety and depression   [place] [facility] at [facility], MD              Passed - Completed PHQ-2 or PHQ-9 in the last 360 days       rosuvastatin  (CRESTOR ) 5 MG tablet [Pharmacy Med Name: ROSUVASTATIN  CALCIUM  5 MG TAB] 30 tablet 5    Sig: Take 1 tablet (5 mg total) by mouth daily.     Cardiovascular:  Antilipid - Statins 2 Failed - 03/02/2024 10:05 AM      Failed - Cr in normal range and within 360 days    Creatinine, Ser  Date Value Ref Range Status  09/20/2021 0.55 (L) 0.76 - 1.27 mg/dL Final         Failed - Lipid Panel in normal range within the last 12 months    Cholesterol, Total   Date Value Ref Range Status  01/28/2023 169 100 - 199 mg/dL Final   LDL Chol Calc (NIH)  Date Value Ref Range Status  01/28/2023 87 0 - 99 mg/dL Final   HDL  Date Value Ref Range Status  01/28/2023 69 >39 mg/dL Final   Triglycerides  Date Value Ref Range Status  01/28/2023 68 0 - 149 mg/dL Final         Passed - Patient is not pregnant      Passed - Valid encounter within last 12 months    Recent Outpatient Visits           6 months ago Anxiety and depression   [place] [facility] Medicine at [facility], Toribio SQUIBB, [place]   11 months ago Anxiety and depression   [facility] Care & [facility] Medicine at [facility] Cathryne JAYSON, [place]

## 2024-03-03 ENCOUNTER — Other Ambulatory Visit: Payer: Self-pay

## 2024-03-05 ENCOUNTER — Encounter: Payer: Self-pay | Admitting: Physician Assistant

## 2024-03-05 ENCOUNTER — Ambulatory Visit (INDEPENDENT_AMBULATORY_CARE_PROVIDER_SITE_OTHER): Admitting: Physician Assistant

## 2024-03-05 VITALS — BP 144/88 | HR 117 | Temp 97.8°F | Ht 73.0 in | Wt 211.0 lb

## 2024-03-05 DIAGNOSIS — Z23 Encounter for immunization: Secondary | ICD-10-CM

## 2024-03-05 DIAGNOSIS — I10 Essential (primary) hypertension: Secondary | ICD-10-CM

## 2024-03-05 DIAGNOSIS — F419 Anxiety disorder, unspecified: Secondary | ICD-10-CM | POA: Diagnosis not present

## 2024-03-05 DIAGNOSIS — C210 Malignant neoplasm of anus, unspecified: Secondary | ICD-10-CM

## 2024-03-05 DIAGNOSIS — F32A Depression, unspecified: Secondary | ICD-10-CM

## 2024-03-05 DIAGNOSIS — R935 Abnormal findings on diagnostic imaging of other abdominal regions, including retroperitoneum: Secondary | ICD-10-CM | POA: Diagnosis not present

## 2024-03-05 DIAGNOSIS — D649 Anemia, unspecified: Secondary | ICD-10-CM | POA: Diagnosis not present

## 2024-03-05 DIAGNOSIS — I2510 Atherosclerotic heart disease of native coronary artery without angina pectoris: Secondary | ICD-10-CM | POA: Diagnosis not present

## 2024-03-05 DIAGNOSIS — F17200 Nicotine dependence, unspecified, uncomplicated: Secondary | ICD-10-CM

## 2024-03-05 MED ORDER — METOPROLOL SUCCINATE ER 25 MG PO TB24: 25.0000 mg | ORAL_TABLET | Freq: Every day | ORAL | 1 refills | Status: AC

## 2024-03-05 MED ORDER — AMLODIPINE BESYLATE 10 MG PO TABS: 10.0000 mg | ORAL_TABLET | Freq: Every day | ORAL | 1 refills | Status: AC

## 2024-03-05 MED ORDER — LOSARTAN POTASSIUM-HCTZ 100-25 MG PO TABS: 1.0000 | ORAL_TABLET | Freq: Every day | ORAL | 1 refills | Status: AC

## 2024-03-05 MED ORDER — ROSUVASTATIN CALCIUM 5 MG PO TABS: 5.0000 mg | ORAL_TABLET | Freq: Every day | ORAL | 1 refills | Status: AC

## 2024-03-05 MED ORDER — SERTRALINE HCL 50 MG PO TABS: 50.0000 mg | ORAL_TABLET | Freq: Every day | ORAL | 1 refills | Status: AC

## 2024-03-05 NOTE — Assessment & Plan Note (Addendum)
 No changes to pharmacotherapy today. Patient endorses ongoing home BP monitoring twice weekly, usually normotensive but had a pot and a half of black coffee this morning and smoked before coming in. Orders:   amLODipine  (NORVASC ) 10 MG tablet; Take 1 tablet (10 mg total) by mouth daily.   losartan -hydrochlorothiazide  (HYZAAR) 100-25 MG tablet; Take 1 tablet by mouth daily.   metoprolol  succinate (TOPROL -XL) 25 MG 24 hr tablet; Take 1 tablet (25 mg total) by mouth daily.   Lipid panel

## 2024-03-05 NOTE — Progress Notes (Signed)
 "   Date:  03/05/2024   Name:  Michael Dalton.   DOB:  12-23-1960   MRN:  [id number]   Chief Complaint: Medical Management of Chronic Issues  HPI  Michael Dalton is a 64 year old male with a history of HTN, HLD, chronic anemia, and hx anal cancer on surveillance who returns for f/u on chronic conditions.   He is closely followed by general surgery and oncology for monitoring of anal cancer, with good progress following excision of the lesion and no evidence of recurrence/metastasis. As far as surveillance for his cancer, per oncology's last note: -DRE and inguinal nodal palpation every 3 to 6 months for 5 years  -Anoscopy every 6 to 12 months x 3 years -CT c/a/p annually x 3 yrs Clinically NED. CT CAP 10/21/2023 showed no evidence of recurrent or metastatic disease. Anoscopy on September 11, 2023, also showed no malignancy. - Repeat CT scan in 1 year unless clinical indicators dictate sooner. - Rad Onc follow-up 12/18/23; Surg Onc/anoscopy 02/26/24 - Med Onc follow-up in 6 months   Still smoking 1 ppd, knows he should quit but is not quite ready. No prior quit attempts.   Overall he feels quite well. He cares for his mother who has dementia, which places a good deal of stress on him.   Last visit we increased sertraline  to 50 mg daily and started rosuvastatin  for severe CAD. He has tolerated both changes well.   Had some bladder wall thickening on recent imaging, no urinary symptoms, last PSA 1y ago.    Medication list has been reviewed and updated.  Active Medications[1]   Review of Systems  Patient Active Problem List   Diagnosis Date Noted   B12 deficiency 09/03/2023   Chronic anemia 09/03/2023   Anxiety and depression 09/03/2023   History of asthma 09/03/2023   Tobacco use disorder 09/03/2023   Emphysema/COPD 09/03/2023   Coronary artery disease 09/03/2023   Anal cancer (HCC) 03/05/2022   Myopia of both eyes 01/19/2019   Nuclear sclerotic cataract of both eyes 01/19/2019    Vitreous floater, bilateral 01/19/2019   Hypertriglyceridemia 11/02/2013   Primary hypertension 10/30/2013   Seasonal allergic rhinitis 10/30/2013   Chronic leukopenia 10/30/2013    Allergies[2]  Immunization History  Administered Date(s) Administered   Hepatitis A, Adult 01/16/2006, 07/21/2008   INFLUENZA, HIGH DOSE SEASONAL PF 03/05/2024   Influenza Inj Mdck Quad Pf 11/30/2018   Influenza, Seasonal, Injecte, Preservative Fre 01/28/2023   Influenza,inj,Quad PF,6+ Mos 01/08/2022   Influenza-Unspecified 12/16/2014, 12/27/2015   PFIZER Comirnaty(Gray Top)Covid-19 Tri-Sucrose Vaccine 05/02/2019, 05/22/2019, 04/07/2020   Pfizer Covid-19 Vaccine Bivalent Booster 38yrs & up 01/03/2021   Pfizer(Comirnaty)Fall Seasonal Vaccine 12 years and older 01/28/2023   Tdap 11/10/2014   Zoster Recombinant(Shingrix ) 01/24/2022, 04/09/2022    History reviewed. No pertinent surgical history.  Social History[3]  Family History  Problem Relation Age of Onset   Stroke Mother    Cancer Father    Hypertension Sister         03/05/2024    7:57 AM 09/03/2023    1:37 PM 03/25/2023    9:44 AM 01/28/2023    8:46 AM  GAD 7 : Generalized Anxiety Score  Nervous, Anxious, on Edge 0 0 0 1  Control/stop worrying 0 0 0 0  Worry too much - different things 0 0 0 0  Trouble relaxing 0 0 0 0  Restless 0 0 0 0  Easily annoyed or irritable 0 0 0 1  Afraid - awful might  happen 0 0 0 0  Total GAD 7 Score 0 0 0 2  Anxiety Difficulty Not difficult at all Not difficult at all Not difficult at all Not difficult at all       03/05/2024    7:56 AM 09/03/2023    1:37 PM 03/25/2023    9:44 AM  Depression screen PHQ 2/9  Decreased Interest 0 0 0  Down, Depressed, Hopeless 0 0 0  PHQ - 2 Score 0 0 0  Altered sleeping 0 0 0  Tired, decreased energy 0 0 0  Change in appetite 0 0 0  Feeling bad or failure about yourself  0 0 0  Trouble concentrating 0 0 0  Moving slowly or fidgety/restless 0 0 0  Suicidal thoughts  0 0 0  PHQ-9 Score 0 0  0   Difficult doing work/chores Not difficult at all Not difficult at all Not difficult at all     Data saved with a previous flowsheet row definition    BP Readings from Last 3 Encounters:  03/05/24 (!) 144/88  09/03/23 118/80  03/25/23 128/80    Wt Readings from Last 3 Encounters:  03/05/24 211 lb (95.7 kg)  09/03/23 207 lb (93.9 kg)  03/25/23 200 lb (90.7 kg)    BP (!) 144/88 (Cuff Size: Large)   Pulse (!) 117   Temp 97.8 F (36.6 C)   Ht 6' 1 (1.854 m)   Wt 211 lb (95.7 kg)   SpO2 98%   BMI 27.84 kg/m   Physical Exam Vitals and nursing note reviewed.  Constitutional:      Appearance: Normal appearance.  Cardiovascular:     Rate and Rhythm: Regular rhythm. Tachycardia present.     Heart sounds: No murmur heard.    No friction rub. No gallop.  Pulmonary:     Effort: Pulmonary effort is normal.     Breath sounds: Decreased breath sounds and rhonchi present. No wheezing or rales.  Abdominal:     General: There is no distension.  Musculoskeletal:        General: Normal range of motion.  Skin:    General: Skin is warm and dry.  Neurological:     Mental Status: He is alert and oriented to person, place, and time.     Gait: Gait is intact.  Psychiatric:        Mood and Affect: Mood and affect normal.     Recent Labs     Component Value Date/Time   NA 134 09/20/2021 1509   K 4.7 09/20/2021 1509   CL 93 (L) 09/20/2021 1509   CO2 23 09/20/2021 1509   GLUCOSE 104 (H) 09/20/2021 1509   BUN 4 (L) 09/20/2021 1509   CREATININE 0.55 (L) 09/20/2021 1509   CALCIUM  9.6 09/20/2021 1509   ALBUMIN 4.5 09/20/2021 1509   AST 21 09/20/2021 1509   ALT 5 09/20/2021 1509   ALKPHOS 60 09/20/2021 1509   BILITOT 0.6 09/20/2021 1509    Lab Results  Component Value Date   WBC 6.3 09/20/2021   HGB 14.3 09/20/2021   HCT 41.3 09/20/2021   MCV 92 09/20/2021   PLT 238 09/20/2021   No results found for: HGBA1C Lab Results  Component Value Date    CHOL 169 01/28/2023   HDL 69 01/28/2023   LDLCALC 87 01/28/2023   TRIG 68 01/28/2023   Lab Results  Component Value Date   TSH 1.870 09/20/2021        Assessment &  Plan Primary hypertension No changes to pharmacotherapy today. Patient endorses ongoing home BP monitoring twice weekly, usually normotensive but had a pot and a half of black coffee this morning and smoked before coming in. Orders:   amLODipine  (NORVASC ) 10 MG tablet; Take 1 tablet (10 mg total) by mouth daily.   losartan -hydrochlorothiazide  (HYZAAR) 100-25 MG tablet; Take 1 tablet by mouth daily.   metoprolol  succinate (TOPROL -XL) 25 MG 24 hr tablet; Take 1 tablet (25 mg total) by mouth daily.   Lipid panel  Anal cancer [facility]) Specialist notes reviewed.  Continue follow-up as planned with oncology and general surgery. Orders:   PSA   Tobacco use disorder Encouraged cessation, referred patient to the [place] system smoking cessation program.  He is in the preparation stage. Orders:   Ambulatory referral to Virtual Care Smoking Cessation   PSA   Coronary artery disease involving native coronary artery of native heart without angina pectoris Tolerating rosuvastatin  well, check fasting lipids today, referring to cardiology for consult on this asymptomatic patient with apparently severe CAD Orders:   rosuvastatin  (CRESTOR ) 5 MG tablet; Take 1 tablet (5 mg total) by mouth daily.   Lipid panel   Ambulatory referral to Cardiology  Anxiety and depression Patient seems to be doing well with current dose of sertraline , he would like to keep the dose the same for now. Orders:   sertraline  (ZOLOFT ) 50 MG tablet; Take 1 tablet (50 mg total) by mouth daily.  Chronic anemia Longstanding, but gradually declining over the last few CBCs.  Repeat today to trend Orders:   CBC with Differential/Platelet  Abnormal findings on diagnostic imaging of other abdominal regions, including retroperitoneum Check PSA Orders:    PSA  Encounter for immunization High-dose flu vaccine administered today Orders:   Flu vaccine HIGH DOSE PF(Fluzone Trivalent)    6 months OV chronic conditions, sooner if needed.   Rolan Hoyle, PA-C, DMSc, DipACLM, Nutritionist [facility] [phone]      [1]  Current Meds  Medication Sig   [DISCONTINUED] amLODipine  (NORVASC ) 10 MG tablet Take 1 tablet (10 mg total) by mouth daily.   [DISCONTINUED] losartan -hydrochlorothiazide  (HYZAAR) 100-25 MG tablet Take 1 tablet by mouth daily.   [DISCONTINUED] metoprolol  succinate (TOPROL -XL) 25 MG 24 hr tablet Take 1 tablet (25 mg total) by mouth daily.   [DISCONTINUED] rosuvastatin  (CRESTOR ) 5 MG tablet TAKE 1 TABLET (5 MG TOTAL) BY MOUTH DAILY.   [DISCONTINUED] sertraline  (ZOLOFT ) 50 MG tablet TAKE 1 TABLET BY MOUTH EVERY DAY  [2]  Allergies Allergen Reactions   Oxycodone Itching  [3]  Social History Tobacco Use   Smoking status: Every Day    Current packs/day: 1.00    Average packs/day: 1 pack/day for 31.0 years (31.0 ttl pk-yrs)    Types: Cigarettes   Smokeless tobacco: Never  Vaping Use   Vaping status: Never Used  Substance Use Topics   Alcohol use: Yes    Alcohol/week: 3.0 standard drinks of alcohol    Types: 3 Cans of beer per week    Comment: 3 beers/ daily   Drug use: Never   "

## 2024-03-05 NOTE — Assessment & Plan Note (Addendum)
 Encouraged cessation, referred patient to the [facility] health system smoking cessation program.  He is in the preparation stage. Orders:   Ambulatory referral to Virtual Care Smoking Cessation   PSA

## 2024-03-05 NOTE — Assessment & Plan Note (Addendum)
 Tolerating rosuvastatin  well, check fasting lipids today, referring to cardiology for consult on this asymptomatic patient with apparently severe CAD Orders:   rosuvastatin  (CRESTOR ) 5 MG tablet; Take 1 tablet (5 mg total) by mouth daily.   Lipid panel   Ambulatory referral to Cardiology

## 2024-03-05 NOTE — Assessment & Plan Note (Addendum)
 Patient seems to be doing well with current dose of sertraline , he would like to keep the dose the same for now. Orders:   sertraline  (ZOLOFT ) 50 MG tablet; Take 1 tablet (50 mg total) by mouth daily.

## 2024-03-05 NOTE — Assessment & Plan Note (Addendum)
 Specialist notes reviewed.  Continue follow-up as planned with oncology and general surgery. Orders:   PSA

## 2024-03-05 NOTE — Assessment & Plan Note (Addendum)
 Longstanding, but gradually declining over the last few CBCs.  Repeat today to trend Orders:   CBC with Differential/Platelet

## 2024-03-06 ENCOUNTER — Ambulatory Visit: Payer: Self-pay | Admitting: Physician Assistant

## 2024-03-06 LAB — CBC WITH DIFFERENTIAL/PLATELET
Basophils Absolute: 0.1 x10E3/uL (ref 0.0–0.2)
Basos: 1 %
EOS (ABSOLUTE): 0.1 x10E3/uL (ref 0.0–0.4)
Eos: 2 %
Hematocrit: 40.3 % (ref 37.5–51.0)
Hemoglobin: 13.2 g/dL (ref 13.0–17.7)
Immature Grans (Abs): 0 x10E3/uL (ref 0.0–0.1)
Immature Granulocytes: 0 %
Lymphocytes Absolute: 0.5 x10E3/uL — ABNORMAL LOW (ref 0.7–3.1)
Lymphs: 12 %
MCH: 30.8 pg (ref 26.6–33.0)
MCHC: 32.8 g/dL (ref 31.5–35.7)
MCV: 94 fL (ref 79–97)
Monocytes Absolute: 0.7 x10E3/uL (ref 0.1–0.9)
Monocytes: 15 %
Neutrophils Absolute: 3.1 x10E3/uL (ref 1.4–7.0)
Neutrophils: 70 %
Platelets: 319 x10E3/uL (ref 150–450)
RBC: 4.29 x10E6/uL (ref 4.14–5.80)
RDW: 12.7 % (ref 11.6–15.4)
WBC: 4.5 x10E3/uL (ref 3.4–10.8)

## 2024-03-06 LAB — PSA: Prostate Specific Ag, Serum: 0.7 ng/mL (ref 0.0–4.0)

## 2024-03-06 LAB — LIPID PANEL
Chol/HDL Ratio: 2.3 ratio (ref 0.0–5.0)
Cholesterol, Total: 174 mg/dL (ref 100–199)
HDL: 75 mg/dL
LDL Chol Calc (NIH): 75 mg/dL (ref 0–99)
Triglycerides: 141 mg/dL (ref 0–149)
VLDL Cholesterol Cal: 24 mg/dL (ref 5–40)

## 2024-03-08 NOTE — Progress Notes (Signed)
 Spoke with patient and informed of lab results. Patient verbalized understanding.  JM

## 2024-03-16 NOTE — Progress Notes (Unsigned)
" °  Cardiology Office Note   Date:  03/16/2024  ID:  Michael Vinita Lennie Mickey., DOB [birth year], MRN [id number] PCP: Manya Toribio SQUIBB, PA  [facility] Providers Cardiologist:  None { Click to update primary MD,subspecialty MD or APP then REFRESH:1}    History of Present Illness Michael Ziomek. is a 64 y.o. male PMH tobacco use, HTN, HLD who presents for severe coronary artery calcification seen on prior PET/CT.  Seen by PCP for this issue 03/05/2024.  Had PET[facility] scan done at [facility] 08/2022 as part of rectal cancer evaluation which showed severe coronary artery calcifications.  Last LDL 75 02/2024.  Relevant CVD History -Severe coronary artery calcifications OSH PET-CT 08/2022   ROS: Pt denies any chest discomfort, jaw pain, arm pain, palpitations, syncope, presyncope, orthopnea, PND, or LE edema.  Studies Reviewed I have independently reviewed the patient's ECG, previous medical records, previous blood work.  Physical Exam VS:  There were no vitals taken for this visit.       Wt Readings from Last 3 Encounters:  03/05/24 211 lb (95.7 kg)  09/03/23 207 lb (93.9 kg)  03/25/23 200 lb (90.7 kg)    GEN: No acute distress. NECK: No JVD; No carotid bruits. CARDIAC: ***RRR, no murmurs, rubs, gallops. RESPIRATORY:  Clear to auscultation. EXTREMITIES:  Warm and well-perfused. No edema.  ASSESSMENT AND PLAN Coronary artery calcifications HLD Tobacco use        {Are you ordering a CV Procedure (e.g. stress test, cath, DCCV, TEE, etc)?   Press F2        :[id number]}  Dispo: ***  Signed, Caron Poser, MD  "

## 2024-03-17 ENCOUNTER — Ambulatory Visit

## 2024-03-24 ENCOUNTER — Telehealth: Payer: Self-pay

## 2024-03-24 ENCOUNTER — Telehealth: Payer: Self-pay | Admitting: *Deleted

## 2024-03-24 NOTE — Telephone Encounter (Signed)
 Is this okay to give verbal orders?  JM

## 2024-03-24 NOTE — Transitions of Care (Post Inpatient/ED Visit) (Signed)
" ° °  03/24/2024  Name: Michael Dalton. MRN: [id number] DOB: 1960-04-05  Today's TOC FU Call Status: Today's TOC FU Call Status:: Unsuccessful Call (1st Attempt) TOC FU Call Complete Date: 03/24/24  Attempted to reach the patient regarding the most recent Inpatient visit  Left HIPAA compliant voice message; call placed after PCP practice TOC call this morning to offer TOC 30-day program enrollment  Follow Up Plan: Additional outreach attempts will be made to reach the patient to complete the Transitions of Care (Post Inpatient/ED visit) call.   Pls call/ message for questions,  Loc Feinstein Mckinney Tyneka Scafidi, RN, BSN, CCRN Alumnus RN Care Manager  Transitions of Care  [facility] [phone]: direct office  "

## 2024-03-24 NOTE — Transitions of Care (Post Inpatient/ED Visit) (Cosign Needed)
" ° °  03/24/2024  Name: Michael Dalton. MRN: [id number] DOB: November 21, 1960  Today's TOC FU Call Status: Today's TOC FU Call Status:: Successful TOC FU Call Completed TOC FU Call Complete Date: 03/24/24  Patient's Name and Date of Birth confirmed. Name, DOB  Transition Care Management Follow-up Telephone Call Date of Discharge: 03/17/24 Discharge Facility: Other [facility]) Name of Other (Non[facility]) Discharge Facility: [facility] Type of Discharge: Emergency Department Reason for ED Visit: Other: (Fall- broken bone) How have you been since you were released from the hospital?: Better Any questions or concerns?: No  Items Reviewed: Did you receive and understand the discharge instructions provided?: Yes Medications obtained,verified, and reconciled?: Yes (Medications Reviewed) Any new allergies since your discharge?: No Dietary orders reviewed?: No Do you have support at home?: Yes People in Home [RPT]: friend(s) Name of Support/Comfort Primary Source: [name]  Medications Reviewed Today: Medications Reviewed Today     Reviewed by Aslin Farinas, Steva SAILOR, CMA (Certified Medical Assistant) on 03/24/24 at [phone]  Med List Status: <None>   Medication Order Taking? Sig Documenting Provider Last Dose Status Informant  acetaminophen  (TYLENOL ) 325 MG tablet [id number] Yes Take 975 mg by mouth. [provider]  Active   amLODipine  (NORVASC ) 10 MG tablet [id number]  Take 1 tablet (10 mg total) by mouth daily. Manya Toribio SQUIBB, PA  Active   ascorbic acid (VITAMIN C) 500 MG tablet [id number]  Take 500 mg by mouth. [provider]  Active   HYDROmorphone  (DILAUDID ) 2 MG tablet [id number] Yes Take 1 mg by mouth. [provider]  Active   losartan -hydrochlorothiazide  (HYZAAR) 100-25 MG tablet [id number] Yes Take 1 tablet by mouth daily. Manya Toribio SQUIBB, PA  Active   metoprolol  succinate (TOPROL -XL) 25 MG 24 hr tablet [id number] Yes Take 1 tablet (25 mg total)  by mouth daily. Manya Toribio SQUIBB, PA  Active   polyethylene glycol powder (GLYCOLAX/MIRALAX) 17 GM/SCOOP powder [id number] Yes Take 17 g by mouth. [provider]  Active   rosuvastatin  (CRESTOR ) 5 MG tablet [id number] Yes Take 1 tablet (5 mg total) by mouth daily. Manya Toribio SQUIBB, PA  Active   sertraline  (ZOLOFT ) 50 MG tablet [id number] Yes Take 1 tablet (50 mg total) by mouth daily. Manya Toribio SQUIBB, PA  Active   traZODone (DESYREL) 50 MG tablet [id number] Yes Take 25 mg by mouth. [provider]  Active             Home Care and Equipment/Supplies: Were Home Health Services Ordered?: Yes Name of Home Health Agency:: [facility] Has Agency set up a time to come to your home?: Yes First Home Health Visit Date: 03/24/24 Any new equipment or medical supplies ordered?: No  Functional Questionnaire: Do you need assistance with bathing/showering or dressing?: No Do you need assistance with meal preparation?: No Do you need assistance with eating?: No Do you have difficulty maintaining continence: No Do you need assistance with getting out of bed/getting out of a chair/moving?: No Do you have difficulty managing or taking your medications?: No  Follow up appointments reviewed:      Michael Dalton, CMA "

## 2024-03-24 NOTE — Telephone Encounter (Signed)
 Copied from CRM [id number]. Topic: Clinical - Home Health Verbal Orders >> Mar 24, 2024 11:44 AM Avram MATSU wrote: Caller/Agency: Dorise Rushing Number: [phone] secure vm Service Requested: Physical Therapy  Frequency: 1*9 to work on strength and balance  Any new concerns about the patient? No

## 2024-03-24 NOTE — Telephone Encounter (Signed)
 Spoke with Dorise and gave verbal order for physical therapy. Once a week for 9 weeks for strength and balance.  JM

## 2024-04-07 ENCOUNTER — Ambulatory Visit: Admitting: Physician Assistant

## 2024-08-30 ENCOUNTER — Ambulatory Visit: Admitting: Physician Assistant
